# Patient Record
Sex: Male | Born: 1935 | Race: White | Hispanic: No | Smoking: Never smoker
Health system: Southern US, Community
[De-identification: ages and names within clinical notes are randomized; demographics above are authoritative.]

## PROBLEM LIST (undated history)

## (undated) DIAGNOSIS — E7800 Pure hypercholesterolemia, unspecified: Secondary | ICD-10-CM

## (undated) DIAGNOSIS — I10 Essential (primary) hypertension: Secondary | ICD-10-CM

## (undated) DIAGNOSIS — D649 Anemia, unspecified: Secondary | ICD-10-CM

## (undated) DIAGNOSIS — F419 Anxiety disorder, unspecified: Secondary | ICD-10-CM

## (undated) DIAGNOSIS — E119 Type 2 diabetes mellitus without complications: Secondary | ICD-10-CM

## (undated) DIAGNOSIS — F329 Major depressive disorder, single episode, unspecified: Secondary | ICD-10-CM

## (undated) DIAGNOSIS — E079 Disorder of thyroid, unspecified: Secondary | ICD-10-CM

## (undated) DIAGNOSIS — Z48815 Encounter for surgical aftercare following surgery on the digestive system: Secondary | ICD-10-CM

## (undated) DIAGNOSIS — F32A Depression, unspecified: Secondary | ICD-10-CM

## (undated) DIAGNOSIS — R1110 Vomiting, unspecified: Secondary | ICD-10-CM

## (undated) DIAGNOSIS — S92401A Displaced unspecified fracture of right great toe, initial encounter for closed fracture: Secondary | ICD-10-CM

## (undated) HISTORY — DX: Depression, unspecified: F32.A

## (undated) HISTORY — DX: Major depressive disorder, single episode, unspecified: F32.9

## (undated) HISTORY — DX: Essential (primary) hypertension: I10

## (undated) HISTORY — DX: Disorder of thyroid, unspecified: E07.9

## (undated) HISTORY — DX: Anxiety disorder, unspecified: F41.9

## (undated) HISTORY — DX: Vomiting, unspecified: R11.10

## (undated) HISTORY — DX: Displaced unspecified fracture of right great toe, initial encounter for closed fracture: S92.401A

## (undated) HISTORY — DX: Type 2 diabetes mellitus without complications: E11.9

## (undated) HISTORY — DX: Encounter for surgical aftercare following surgery on the digestive system: Z48.815

---

## 1985-07-08 HISTORY — PX: CORONARY ARTERY BYPASS GRAFT: SHX141

## 2006-02-28 ENCOUNTER — Ambulatory Visit: Payer: Self-pay | Admitting: Internal Medicine

## 2006-03-12 ENCOUNTER — Ambulatory Visit: Payer: Self-pay | Admitting: Gastroenterology

## 2006-03-12 ENCOUNTER — Encounter: Payer: Self-pay | Admitting: Gastroenterology

## 2006-03-13 ENCOUNTER — Ambulatory Visit: Payer: Self-pay | Admitting: Internal Medicine

## 2006-04-10 ENCOUNTER — Ambulatory Visit: Payer: Self-pay | Admitting: Internal Medicine

## 2006-04-23 ENCOUNTER — Ambulatory Visit: Payer: Self-pay | Admitting: Gastroenterology

## 2006-04-23 ENCOUNTER — Encounter: Payer: Self-pay | Admitting: Gastroenterology

## 2006-04-30 ENCOUNTER — Ambulatory Visit: Payer: Self-pay | Admitting: Gastroenterology

## 2006-05-08 ENCOUNTER — Ambulatory Visit: Payer: Self-pay | Admitting: Internal Medicine

## 2006-06-07 ENCOUNTER — Ambulatory Visit: Payer: Self-pay | Admitting: Internal Medicine

## 2006-07-08 ENCOUNTER — Ambulatory Visit: Payer: Self-pay | Admitting: Internal Medicine

## 2006-08-08 ENCOUNTER — Ambulatory Visit: Payer: Self-pay | Admitting: Internal Medicine

## 2006-09-06 ENCOUNTER — Ambulatory Visit: Payer: Self-pay | Admitting: Internal Medicine

## 2006-10-07 ENCOUNTER — Ambulatory Visit: Payer: Self-pay | Admitting: Internal Medicine

## 2006-11-06 ENCOUNTER — Ambulatory Visit: Payer: Self-pay | Admitting: Internal Medicine

## 2006-12-07 ENCOUNTER — Ambulatory Visit: Payer: Self-pay | Admitting: Internal Medicine

## 2007-01-06 ENCOUNTER — Ambulatory Visit: Payer: Self-pay | Admitting: Internal Medicine

## 2007-02-06 ENCOUNTER — Ambulatory Visit: Payer: Self-pay | Admitting: Internal Medicine

## 2007-03-09 ENCOUNTER — Ambulatory Visit: Payer: Self-pay | Admitting: Internal Medicine

## 2007-04-08 ENCOUNTER — Ambulatory Visit: Payer: Self-pay | Admitting: Internal Medicine

## 2007-04-16 ENCOUNTER — Ambulatory Visit: Payer: Self-pay | Admitting: Internal Medicine

## 2007-05-09 ENCOUNTER — Ambulatory Visit: Payer: Self-pay | Admitting: Internal Medicine

## 2007-06-08 ENCOUNTER — Ambulatory Visit: Payer: Self-pay | Admitting: Internal Medicine

## 2007-07-09 ENCOUNTER — Ambulatory Visit: Payer: Self-pay | Admitting: Internal Medicine

## 2007-08-19 ENCOUNTER — Ambulatory Visit: Payer: Self-pay | Admitting: Internal Medicine

## 2007-09-17 ENCOUNTER — Ambulatory Visit: Payer: Self-pay | Admitting: Internal Medicine

## 2007-09-30 ENCOUNTER — Ambulatory Visit: Payer: Self-pay | Admitting: Internal Medicine

## 2007-10-07 ENCOUNTER — Ambulatory Visit: Payer: Self-pay | Admitting: Internal Medicine

## 2007-11-11 ENCOUNTER — Ambulatory Visit: Payer: Self-pay | Admitting: Internal Medicine

## 2007-12-07 ENCOUNTER — Ambulatory Visit: Payer: Self-pay | Admitting: Internal Medicine

## 2008-01-06 ENCOUNTER — Ambulatory Visit: Payer: Self-pay | Admitting: Internal Medicine

## 2008-02-06 ENCOUNTER — Ambulatory Visit: Payer: Self-pay | Admitting: Internal Medicine

## 2008-03-02 ENCOUNTER — Encounter: Payer: Self-pay | Admitting: Gastroenterology

## 2008-03-08 ENCOUNTER — Ambulatory Visit: Payer: Self-pay | Admitting: Internal Medicine

## 2008-03-22 ENCOUNTER — Encounter: Payer: Self-pay | Admitting: Gastroenterology

## 2008-03-24 ENCOUNTER — Encounter: Payer: Self-pay | Admitting: Gastroenterology

## 2008-03-24 ENCOUNTER — Ambulatory Visit: Payer: Self-pay | Admitting: Gastroenterology

## 2008-03-29 ENCOUNTER — Encounter: Payer: Self-pay | Admitting: Gastroenterology

## 2008-04-01 ENCOUNTER — Encounter: Payer: Self-pay | Admitting: Gastroenterology

## 2008-04-06 ENCOUNTER — Encounter: Payer: Self-pay | Admitting: Gastroenterology

## 2008-04-07 ENCOUNTER — Ambulatory Visit: Payer: Self-pay | Admitting: Internal Medicine

## 2008-04-08 ENCOUNTER — Encounter: Payer: Self-pay | Admitting: Gastroenterology

## 2008-04-14 ENCOUNTER — Ambulatory Visit: Payer: Self-pay | Admitting: Gastroenterology

## 2008-04-14 ENCOUNTER — Encounter: Payer: Self-pay | Admitting: Gastroenterology

## 2008-04-14 ENCOUNTER — Ambulatory Visit (HOSPITAL_COMMUNITY): Admission: RE | Admit: 2008-04-14 | Discharge: 2008-04-14 | Payer: Self-pay | Admitting: Gastroenterology

## 2008-04-14 ENCOUNTER — Telehealth: Payer: Self-pay | Admitting: Internal Medicine

## 2008-04-25 ENCOUNTER — Ambulatory Visit: Payer: Self-pay | Admitting: Internal Medicine

## 2008-04-29 ENCOUNTER — Ambulatory Visit: Payer: Self-pay | Admitting: Internal Medicine

## 2008-05-02 ENCOUNTER — Ambulatory Visit: Payer: Self-pay | Admitting: Internal Medicine

## 2008-05-08 ENCOUNTER — Ambulatory Visit: Payer: Self-pay | Admitting: Internal Medicine

## 2008-06-07 ENCOUNTER — Ambulatory Visit: Payer: Self-pay | Admitting: Internal Medicine

## 2010-08-16 ENCOUNTER — Ambulatory Visit: Payer: Self-pay | Admitting: Internal Medicine

## 2010-08-21 ENCOUNTER — Ambulatory Visit: Payer: Self-pay | Admitting: Internal Medicine

## 2010-08-23 ENCOUNTER — Ambulatory Visit: Payer: Self-pay | Admitting: Internal Medicine

## 2010-09-06 ENCOUNTER — Ambulatory Visit: Payer: Self-pay | Admitting: Internal Medicine

## 2010-10-07 ENCOUNTER — Ambulatory Visit: Payer: Self-pay | Admitting: Internal Medicine

## 2010-11-06 ENCOUNTER — Ambulatory Visit: Payer: Self-pay | Admitting: Internal Medicine

## 2010-11-06 ENCOUNTER — Ambulatory Visit: Payer: Self-pay

## 2010-12-07 ENCOUNTER — Ambulatory Visit: Payer: Self-pay | Admitting: Internal Medicine

## 2010-12-07 HISTORY — PX: CORONARY ANGIOPLASTY WITH STENT PLACEMENT: SHX49

## 2011-01-06 ENCOUNTER — Ambulatory Visit: Payer: Self-pay | Admitting: Internal Medicine

## 2011-01-15 ENCOUNTER — Ambulatory Visit: Payer: Self-pay | Admitting: Internal Medicine

## 2011-02-06 ENCOUNTER — Ambulatory Visit: Payer: Self-pay | Admitting: Internal Medicine

## 2011-02-06 ENCOUNTER — Ambulatory Visit: Payer: Self-pay

## 2011-03-09 ENCOUNTER — Ambulatory Visit: Payer: Self-pay | Admitting: Internal Medicine

## 2011-04-08 ENCOUNTER — Ambulatory Visit: Payer: Self-pay | Admitting: Internal Medicine

## 2011-04-09 LAB — BODY FLUID CELL COUNT WITH DIFFERENTIAL
Lymphs, Fluid: 21
Neutrophil Count, Fluid: 3
Total Nucleated Cell Count, Fluid: 137

## 2011-04-09 LAB — DIFFERENTIAL
Lymphocytes Relative: 16
Lymphs Abs: 0.5 — ABNORMAL LOW
Neutro Abs: 2
Neutrophils Relative %: 67

## 2011-04-09 LAB — PROTEIN, BODY FLUID: Total protein, fluid: <3

## 2011-04-09 LAB — CBC
HCT: 35.6 — ABNORMAL LOW
Platelets: 100 — ABNORMAL LOW
WBC: 3.1 — ABNORMAL LOW

## 2011-04-09 LAB — GRAM STAIN

## 2011-04-09 LAB — GLUCOSE, CAPILLARY: Glucose-Capillary: 76

## 2011-04-09 LAB — ALBUMIN, FLUID (OTHER): Albumin, Fluid: 1.1

## 2011-05-09 ENCOUNTER — Ambulatory Visit: Payer: Self-pay | Admitting: Internal Medicine

## 2011-06-08 ENCOUNTER — Ambulatory Visit: Payer: Self-pay | Admitting: Internal Medicine

## 2011-07-05 ENCOUNTER — Ambulatory Visit: Payer: Self-pay | Admitting: Internal Medicine

## 2011-07-09 ENCOUNTER — Ambulatory Visit: Payer: Self-pay | Admitting: Internal Medicine

## 2011-07-18 LAB — CANCER CENTER HEMOGLOBIN: HGB: 8.9 g/dL — ABNORMAL LOW (ref 13.0–18.0)

## 2011-08-01 LAB — CANCER CENTER HEMOGLOBIN: HGB: 9.3 g/dL — ABNORMAL LOW (ref 13.0–18.0)

## 2011-08-09 ENCOUNTER — Ambulatory Visit: Payer: Self-pay | Admitting: Internal Medicine

## 2011-08-29 LAB — CANCER CENTER HEMOGLOBIN: HGB: 9.2 g/dL — ABNORMAL LOW (ref 13.0–18.0)

## 2011-09-06 ENCOUNTER — Ambulatory Visit: Payer: Self-pay | Admitting: Internal Medicine

## 2011-09-06 HISTORY — PX: CORONARY ANGIOPLASTY WITH STENT PLACEMENT: SHX49

## 2011-09-26 LAB — CANCER CENTER HEMOGLOBIN: HGB: 9.2 g/dL — ABNORMAL LOW (ref 13.0–18.0)

## 2011-10-07 ENCOUNTER — Ambulatory Visit: Payer: Self-pay | Admitting: Internal Medicine

## 2011-10-10 LAB — CANCER CENTER HEMOGLOBIN: HGB: 9.7 g/dL — ABNORMAL LOW (ref 13.0–18.0)

## 2011-10-24 LAB — CANCER CENTER HEMOGLOBIN: HGB: 9.5 g/dL — ABNORMAL LOW (ref 13.0–18.0)

## 2011-10-25 ENCOUNTER — Encounter: Payer: Self-pay | Admitting: Internal Medicine

## 2011-11-06 ENCOUNTER — Ambulatory Visit: Payer: Self-pay | Admitting: Internal Medicine

## 2011-11-06 ENCOUNTER — Encounter: Payer: Self-pay | Admitting: Internal Medicine

## 2011-11-07 LAB — IRON AND TIBC
Iron Bind.Cap.(Total): 255 ug/dL (ref 250–450)
Iron: 88 ug/dL (ref 65–175)
Unbound Iron-Bind.Cap.: 167 ug/dL

## 2011-11-07 LAB — CANCER CENTER HEMOGLOBIN: HGB: 9.5 g/dL — ABNORMAL LOW (ref 13.0–18.0)

## 2011-11-21 LAB — CBC CANCER CENTER
Basophil #: 0 x10 3/mm (ref 0.0–0.1)
Basophil %: 1.1 %
HCT: 27.5 % — ABNORMAL LOW (ref 40.0–52.0)
Lymphocyte #: 0.6 x10 3/mm — ABNORMAL LOW (ref 1.0–3.6)
Lymphocyte %: 12.9 %
MCH: 33.4 pg (ref 26.0–34.0)
MCV: 98 fL (ref 80–100)
Monocyte %: 12.6 %
Neutrophil #: 2.3 x10 3/mm (ref 1.4–6.5)
Neutrophil %: 54.3 %
RBC: 2.82 10*6/uL — ABNORMAL LOW (ref 4.40–5.90)
RDW: 14.3 % (ref 11.5–14.5)

## 2011-12-05 LAB — CANCER CENTER HEMOGLOBIN: HGB: 9.8 g/dL — ABNORMAL LOW (ref 13.0–18.0)

## 2011-12-07 ENCOUNTER — Encounter: Payer: Self-pay | Admitting: Internal Medicine

## 2011-12-07 ENCOUNTER — Ambulatory Visit: Payer: Self-pay | Admitting: Internal Medicine

## 2011-12-19 LAB — CANCER CENTER HEMOGLOBIN: HGB: 9.1 g/dL — ABNORMAL LOW (ref 13.0–18.0)

## 2012-01-06 ENCOUNTER — Encounter: Payer: Self-pay | Admitting: Internal Medicine

## 2012-01-06 ENCOUNTER — Ambulatory Visit: Payer: Self-pay | Admitting: Internal Medicine

## 2012-01-15 LAB — CANCER CENTER HEMOGLOBIN: HGB: 9.1 g/dL — ABNORMAL LOW (ref 13.0–18.0)

## 2012-01-30 LAB — CANCER CENTER HEMOGLOBIN: HGB: 9.5 g/dL — ABNORMAL LOW (ref 13.0–18.0)

## 2012-02-06 ENCOUNTER — Ambulatory Visit: Payer: Self-pay | Admitting: Internal Medicine

## 2012-02-13 LAB — CBC CANCER CENTER
Basophil #: 0 x10 3/mm (ref 0.0–0.1)
Basophil %: 0.9 %
HGB: 8.9 g/dL — ABNORMAL LOW (ref 13.0–18.0)
Lymphocyte #: 0.5 x10 3/mm — ABNORMAL LOW (ref 1.0–3.6)
Lymphocyte %: 13.6 %
MCHC: 33.9 g/dL (ref 32.0–36.0)
MCV: 99 fL (ref 80–100)
Monocyte #: 0.4 x10 3/mm (ref 0.2–1.0)
Neutrophil #: 1.9 x10 3/mm (ref 1.4–6.5)
Neutrophil %: 56.5 %
RBC: 2.67 10*6/uL — ABNORMAL LOW (ref 4.40–5.90)

## 2012-02-27 LAB — CANCER CENTER HEMOGLOBIN: HGB: 9.3 g/dL — ABNORMAL LOW (ref 13.0–18.0)

## 2012-03-08 ENCOUNTER — Ambulatory Visit: Payer: Self-pay | Admitting: Internal Medicine

## 2012-04-07 ENCOUNTER — Ambulatory Visit: Payer: Self-pay | Admitting: Internal Medicine

## 2012-04-23 LAB — CANCER CENTER HEMOGLOBIN: HGB: 9.4 g/dL — ABNORMAL LOW (ref 13.0–18.0)

## 2012-04-23 LAB — IRON AND TIBC
Iron Bind.Cap.(Total): 262 ug/dL (ref 250–450)
Iron: 72 ug/dL (ref 65–175)

## 2012-05-07 LAB — CBC CANCER CENTER
Basophil #: 0 x10 3/mm (ref 0.0–0.1)
HCT: 25.9 % — ABNORMAL LOW (ref 40.0–52.0)
Lymphocyte #: 0.5 x10 3/mm — ABNORMAL LOW (ref 1.0–3.6)
MCH: 33.5 pg (ref 26.0–34.0)
MCHC: 34.2 g/dL (ref 32.0–36.0)
MCV: 98 fL (ref 80–100)
Monocyte #: 0.5 x10 3/mm (ref 0.2–1.0)
Monocyte %: 12.1 %
Platelet: 106 x10 3/mm — ABNORMAL LOW (ref 150–440)
RDW: 13.2 % (ref 11.5–14.5)
WBC: 4.2 x10 3/mm (ref 3.8–10.6)

## 2012-05-08 ENCOUNTER — Ambulatory Visit: Payer: Self-pay | Admitting: Internal Medicine

## 2012-05-28 LAB — CANCER CENTER HEMOGLOBIN: HGB: 9.1 g/dL — ABNORMAL LOW (ref 13.0–18.0)

## 2012-06-07 ENCOUNTER — Ambulatory Visit: Payer: Self-pay | Admitting: Internal Medicine

## 2012-07-08 ENCOUNTER — Ambulatory Visit: Payer: Self-pay | Admitting: Internal Medicine

## 2012-08-08 ENCOUNTER — Ambulatory Visit: Payer: Self-pay | Admitting: Internal Medicine

## 2012-08-24 LAB — CBC CANCER CENTER
Basophil #: 0 x10 3/mm (ref 0.0–0.1)
Eosinophil #: 0.5 x10 3/mm (ref 0.0–0.7)
HGB: 9.1 g/dL — ABNORMAL LOW (ref 13.0–18.0)
Lymphocyte #: 0.5 x10 3/mm — ABNORMAL LOW (ref 1.0–3.6)
MCH: 33.3 pg (ref 26.0–34.0)
MCHC: 34.7 g/dL (ref 32.0–36.0)
MCV: 96 fL (ref 80–100)
Monocyte %: 11.9 %
WBC: 4.1 x10 3/mm (ref 3.8–10.6)

## 2012-09-05 ENCOUNTER — Ambulatory Visit: Payer: Self-pay | Admitting: Internal Medicine

## 2012-09-21 LAB — BASIC METABOLIC PANEL
BUN: 33 mg/dL — AB (ref 4–21)
Creatinine: 2.2 mg/dL — AB (ref <=1.3)

## 2012-10-06 ENCOUNTER — Ambulatory Visit: Payer: Self-pay | Admitting: Internal Medicine

## 2012-10-15 LAB — IRON AND TIBC
Iron Bind.Cap.(Total): 288 ug/dL (ref 250–450)
Iron Saturation: 31 %
Iron: 88 ug/dL (ref 65–175)
Unbound Iron-Bind.Cap.: 200 ug/dL

## 2012-10-15 LAB — CANCER CENTER HEMOGLOBIN: HGB: 9.4 g/dL — ABNORMAL LOW (ref 13.0–18.0)

## 2012-11-05 ENCOUNTER — Ambulatory Visit: Payer: Self-pay | Admitting: Internal Medicine

## 2012-11-12 LAB — CBC CANCER CENTER
Basophil %: 0.7 %
HCT: 26.5 % — ABNORMAL LOW (ref 40.0–52.0)
HGB: 8.9 g/dL — ABNORMAL LOW (ref 13.0–18.0)
Lymphocyte #: 0.4 x10 3/mm — ABNORMAL LOW (ref 1.0–3.6)
Lymphocyte %: 10.5 %
MCH: 32.5 pg (ref 26.0–34.0)
MCV: 97 fL (ref 80–100)
Neutrophil #: 2.7 x10 3/mm (ref 1.4–6.5)

## 2012-12-06 ENCOUNTER — Ambulatory Visit: Payer: Self-pay | Admitting: Internal Medicine

## 2013-01-07 ENCOUNTER — Ambulatory Visit: Payer: Self-pay | Admitting: Internal Medicine

## 2013-01-07 LAB — CANCER CENTER HEMOGLOBIN: HGB: 9.4 g/dL — ABNORMAL LOW (ref 13.0–18.0)

## 2013-02-05 ENCOUNTER — Ambulatory Visit: Payer: Self-pay | Admitting: Internal Medicine

## 2013-03-08 ENCOUNTER — Ambulatory Visit: Payer: Self-pay | Admitting: Internal Medicine

## 2013-04-29 ENCOUNTER — Ambulatory Visit: Payer: Self-pay | Admitting: Internal Medicine

## 2013-04-29 LAB — CANCER CENTER HEMOGLOBIN: HGB: 8.8 g/dL — ABNORMAL LOW (ref 13.0–18.0)

## 2013-05-08 ENCOUNTER — Ambulatory Visit: Payer: Self-pay | Admitting: Internal Medicine

## 2013-06-24 ENCOUNTER — Ambulatory Visit: Payer: Self-pay | Admitting: Internal Medicine

## 2013-06-24 LAB — CANCER CENTER HEMOGLOBIN: HGB: 9.3 g/dL — ABNORMAL LOW (ref 13.0–18.0)

## 2013-07-08 ENCOUNTER — Ambulatory Visit: Payer: Self-pay | Admitting: Internal Medicine

## 2013-07-08 HISTORY — PX: CARDIAC DEFIBRILLATOR PLACEMENT: SHX171

## 2013-08-18 ENCOUNTER — Ambulatory Visit: Payer: Self-pay | Admitting: Internal Medicine

## 2013-08-19 LAB — IRON AND TIBC
IRON BIND. CAP.(TOTAL): 262 ug/dL (ref 250–450)
IRON: 65 ug/dL (ref 65–175)
Iron Saturation: 25 %
UNBOUND IRON-BIND. CAP.: 197 ug/dL

## 2013-08-19 LAB — CANCER CENTER HEMOGLOBIN: HGB: 8.9 g/dL — AB (ref 13.0–18.0)

## 2013-08-19 LAB — FOLATE: FOLIC ACID: 57.7 ng/mL (ref 3.1–100.0)

## 2013-08-19 LAB — FERRITIN: Ferritin (ARMC): 113 ng/mL (ref 8–388)

## 2013-09-05 ENCOUNTER — Ambulatory Visit: Payer: Self-pay | Admitting: Internal Medicine

## 2013-10-14 ENCOUNTER — Ambulatory Visit: Payer: Self-pay | Admitting: Internal Medicine

## 2013-10-14 LAB — CBC CANCER CENTER
Basophil #: 0 x10 3/mm (ref 0.0–0.1)
Basophil %: 1.1 %
EOS PCT: 12.6 %
Eosinophil #: 0.5 x10 3/mm (ref 0.0–0.7)
HCT: 27.7 % — AB (ref 40.0–52.0)
HGB: 9.2 g/dL — ABNORMAL LOW (ref 13.0–18.0)
Lymphocyte #: 0.5 x10 3/mm — ABNORMAL LOW (ref 1.0–3.6)
Lymphocyte %: 13.4 %
MCH: 32.6 pg (ref 26.0–34.0)
MCHC: 33.4 g/dL (ref 32.0–36.0)
MCV: 98 fL (ref 80–100)
Monocyte #: 0.4 x10 3/mm (ref 0.2–1.0)
Monocyte %: 10.7 %
NEUTROS ABS: 2.4 x10 3/mm (ref 1.4–6.5)
Neutrophil %: 62.2 %
Platelet: 110 x10 3/mm — ABNORMAL LOW (ref 150–440)
RBC: 2.83 10*6/uL — AB (ref 4.40–5.90)
RDW: 13.7 % (ref 11.5–14.5)
WBC: 3.9 x10 3/mm (ref 3.8–10.6)

## 2013-10-22 ENCOUNTER — Encounter: Payer: Self-pay | Admitting: Internal Medicine

## 2013-11-05 ENCOUNTER — Ambulatory Visit: Payer: Self-pay | Admitting: Internal Medicine

## 2014-02-18 DIAGNOSIS — R7689 Other specified abnormal immunological findings in serum: Secondary | ICD-10-CM | POA: Insufficient documentation

## 2014-02-18 DIAGNOSIS — R768 Other specified abnormal immunological findings in serum: Secondary | ICD-10-CM | POA: Insufficient documentation

## 2014-06-23 LAB — PSA

## 2014-06-23 LAB — HEMOGLOBIN A1C: Hgb A1c MFr Bld: 5.9 % (ref 4.0–6.0)

## 2014-06-23 LAB — TSH: TSH: 1.12 u[IU]/mL (ref <=5.90)

## 2014-07-16 LAB — HM DIABETES EYE EXAM

## 2014-10-19 DIAGNOSIS — R1313 Dysphagia, pharyngeal phase: Secondary | ICD-10-CM | POA: Insufficient documentation

## 2014-11-14 ENCOUNTER — Encounter: Payer: Self-pay | Admitting: Internal Medicine

## 2014-11-14 DIAGNOSIS — M179 Osteoarthritis of knee, unspecified: Secondary | ICD-10-CM | POA: Insufficient documentation

## 2014-11-14 DIAGNOSIS — M1710 Unilateral primary osteoarthritis, unspecified knee: Secondary | ICD-10-CM | POA: Insufficient documentation

## 2014-11-14 DIAGNOSIS — N189 Chronic kidney disease, unspecified: Secondary | ICD-10-CM

## 2014-11-14 DIAGNOSIS — I2510 Atherosclerotic heart disease of native coronary artery without angina pectoris: Secondary | ICD-10-CM | POA: Insufficient documentation

## 2014-11-14 DIAGNOSIS — E785 Hyperlipidemia, unspecified: Secondary | ICD-10-CM

## 2014-11-14 DIAGNOSIS — N183 Chronic kidney disease, stage 3 unspecified: Secondary | ICD-10-CM | POA: Insufficient documentation

## 2014-11-14 DIAGNOSIS — D631 Anemia in chronic kidney disease: Secondary | ICD-10-CM | POA: Insufficient documentation

## 2014-11-14 DIAGNOSIS — E039 Hypothyroidism, unspecified: Secondary | ICD-10-CM | POA: Insufficient documentation

## 2014-11-14 DIAGNOSIS — I10 Essential (primary) hypertension: Secondary | ICD-10-CM | POA: Insufficient documentation

## 2014-11-14 DIAGNOSIS — F321 Major depressive disorder, single episode, moderate: Secondary | ICD-10-CM | POA: Insufficient documentation

## 2014-11-14 DIAGNOSIS — E119 Type 2 diabetes mellitus without complications: Secondary | ICD-10-CM | POA: Insufficient documentation

## 2014-11-14 DIAGNOSIS — I255 Ischemic cardiomyopathy: Secondary | ICD-10-CM | POA: Insufficient documentation

## 2014-11-14 DIAGNOSIS — R634 Abnormal weight loss: Secondary | ICD-10-CM | POA: Insufficient documentation

## 2014-11-14 DIAGNOSIS — I700 Atherosclerosis of aorta: Secondary | ICD-10-CM | POA: Insufficient documentation

## 2014-11-14 DIAGNOSIS — E1169 Type 2 diabetes mellitus with other specified complication: Secondary | ICD-10-CM | POA: Insufficient documentation

## 2014-11-14 DIAGNOSIS — D291 Benign neoplasm of prostate: Secondary | ICD-10-CM | POA: Insufficient documentation

## 2014-11-14 DIAGNOSIS — D692 Other nonthrombocytopenic purpura: Secondary | ICD-10-CM | POA: Insufficient documentation

## 2014-11-14 DIAGNOSIS — F5101 Primary insomnia: Secondary | ICD-10-CM | POA: Insufficient documentation

## 2015-03-27 ENCOUNTER — Other Ambulatory Visit: Payer: Self-pay | Admitting: Internal Medicine

## 2015-05-08 ENCOUNTER — Ambulatory Visit (INDEPENDENT_AMBULATORY_CARE_PROVIDER_SITE_OTHER): Payer: Medicare Other

## 2015-05-08 DIAGNOSIS — Z23 Encounter for immunization: Secondary | ICD-10-CM | POA: Diagnosis not present

## 2015-05-19 ENCOUNTER — Other Ambulatory Visit: Payer: Self-pay | Admitting: Internal Medicine

## 2015-05-24 ENCOUNTER — Other Ambulatory Visit: Payer: Self-pay | Admitting: Internal Medicine

## 2015-08-04 ENCOUNTER — Ambulatory Visit
Admission: EM | Admit: 2015-08-04 | Discharge: 2015-08-04 | Disposition: A | Discharge status: Home / Self Care | Payer: 59 | Attending: Family Medicine | Admitting: Family Medicine

## 2015-08-04 ENCOUNTER — Ambulatory Visit (INDEPENDENT_AMBULATORY_CARE_PROVIDER_SITE_OTHER): Payer: 59

## 2015-08-04 DIAGNOSIS — J40 Bronchitis, not specified as acute or chronic: Secondary | ICD-10-CM

## 2015-08-04 HISTORY — DX: Anemia, unspecified: D64.9

## 2015-08-04 HISTORY — DX: Pure hypercholesterolemia, unspecified: E78.00

## 2015-08-04 MED ORDER — ALBUTEROL SULFATE HFA 108 (90 BASE) MCG/ACT IN AERS: 1.0000 | INHALATION_SPRAY | Freq: Four times a day (QID) | RESPIRATORY_TRACT | Status: DC | PRN

## 2015-08-04 MED ORDER — LEVOFLOXACIN 500 MG PO TABS: 500.0000 mg | ORAL_TABLET | Freq: Every day | ORAL | Status: DC

## 2015-08-04 MED ORDER — HYDROCOD POLST-CPM POLST ER 10-8 MG/5ML PO SUER: 2.5000 mL | Freq: Two times a day (BID) | ORAL | Status: DC

## 2015-08-04 NOTE — ED Provider Notes (Signed)
CSN: [id number]     Arrival date & time 08/04/15  [id number] History   First MD Initiated Contact with Patient 08/04/15 1020     Chief Complaint  Patient presents with  . URI   (Consider location/radiation/quality/duration/timing/severity/associated sxs/prior Treatment) HPI   This is an 82 year old male who has had a one half week history of cough. He states that the cough is productive some yellow and some white sputum. He has recently been under the care at [facility] for esophageal dysmotility and has had esophageal dilatation. He states that he will vomit frequently sometimes eating half a meal vomiting up in an returning to the rest. Has last 40 pounds over this last year. Quite possible that he may have aspirated. Cough sounds awful but he is afebrile today with an O2 sat of 98% on room air. He does not smoke and never has smoked execpt for a brief period when he was 82 years old.  Past Medical History  Diagnosis Date  . Hypercholesteremia   . Anemia    Past Surgical History  Procedure Laterality Date  . Coronary artery bypass graft  1987    5 vessel  . Coronary angioplasty with stent placement  12/2010  . Coronary angioplasty with stent placement  09/2011    DES placed  . Cardiac defibrillator placement  2015   Family History  Problem Relation Age of Onset  . Stroke Mother   . CAD Father   . Diabetes Brother    Social History  Substance Use Topics  . Smoking status: Never Smoker   . Smokeless tobacco: None  . Alcohol Use: No    Review of Systems  Constitutional: Positive for appetite change and fatigue. Negative for fever and chills.  HENT: Positive for congestion, postnasal drip and sinus pressure.   Respiratory: Positive for cough and shortness of breath. Negative for wheezing and stridor.   Gastrointestinal: Positive for nausea and vomiting.  All other systems reviewed and are negative.   Allergies  Review of patient's allergies indicates not on  file.  Home Medications   Prior to Admission medications   Medication Sig Start Date End Date Taking? Authorizing Provider  aspirin 81 MG chewable tablet Chew 1 tablet by mouth daily.   Yes Historical Provider, MD  clonazePAM (KLONOPIN) 0.25 MG disintegrating tablet Take 1 tablet by mouth 3 (three) times daily as needed. 06/23/14  Yes Historical Provider, MD  finasteride (PROSCAR) 5 MG tablet Take 1 tablet by mouth daily. 11/22/13  Yes Historical Provider, MD  furosemide (LASIX) 20 MG tablet Take 1 tablet by mouth 3 (three) times a week.   Yes Historical Provider, MD  glipiZIDE (GLUCOTROL XL) 5 MG 24 hr tablet Take 1 tablet by mouth daily. 04/22/14  Yes Historical Provider, MD  glucose blood test strip 1 each by Other route 2 (two) times daily. 07/25/14  Yes Historical Provider, MD  hydrALAZINE (APRESOLINE) 100 MG tablet Take 1 tablet by mouth 3 (three) times daily.   Yes Historical Provider, MD  lactulose (CHRONULAC) 10 GM/15ML solution Take by mouth.   Yes Historical Provider, MD  levothyroxine (SYNTHROID, LEVOTHROID) 75 MCG tablet TAKE 1 TABLET DAILY 05/24/15  Yes Glean Hess, MD  metoprolol succinate (TOPROL-XL) 25 MG 24 hr tablet Take 1 tablet by mouth daily.   Yes Historical Provider, MD  ondansetron (ZOFRAN) 4 MG tablet Take 1 tablet by mouth 3 (three) times daily as needed.   Yes Historical Provider, MD  simvastatin (ZOCOR) 20 MG  tablet TAKE 1 TABLET AT BEDTIME 05/19/15  Yes Glean Hess, MD  tamsulosin [facility]) 0.4 MG CAPS capsule TAKE 1 CAPSULE TWICE A DAY 03/27/15  Yes Glean Hess, MD  ticagrelor (BRILINTA) 90 MG TABS tablet Take 1 tablet by mouth 2 (two) times daily.   Yes Historical Provider, MD  albuterol (PROVENTIL HFA;VENTOLIN HFA) 108 (90 Base) MCG/ACT inhaler Inhale 1-2 puffs into the lungs every 6 (six) hours as needed for wheezing or shortness of breath. 08/04/15   Lorin Picket, PA-C  chlorpheniramine-HYDROcodone (TUSSIONEX PENNKINETIC ER) 10-8 MG/5ML SUER Take  2.5 mLs by mouth 2 (two) times daily. 08/04/15   Lorin Picket, PA-C  levofloxacin (LEVAQUIN) 500 MG tablet Take 1 tablet (500 mg total) by mouth daily. 08/04/15   Lorin Picket, PA-C  nitroGLYCERIN (NITRODUR - DOSED IN MG/24 HR) 0.4 mg/hr patch Place 1 patch onto the skin daily.    Historical Provider, MD  nitroGLYCERIN (NITROSTAT) 0.4 MG SL tablet Place 1 tablet under the tongue as needed.    Historical Provider, MD  PEDIATRIC MULTIPLE VIT-C-FA PO Take by mouth.    Historical Provider, MD   Meds Ordered and Administered this Visit  Medications - No data to display  BP 152/63 mmHg  Pulse 64  Temp(Src) 97.1 F (36.2 C) (Tympanic)  Resp 18  Ht 5' 8.5" (1.74 m)  Wt 130 lb (58.968 kg)  BMI 19.48 kg/m2  SpO2 98% No data found.   Physical Exam  Constitutional: He is oriented to person, place, and time. He appears well-developed and well-nourished. No distress.  HENT:  Head: Normocephalic and atraumatic.  Right Ear: External ear normal.  Left Ear: External ear normal.  Nose: Nose normal.  Mouth/Throat: Oropharynx is clear and moist. No oropharyngeal exudate.  Eyes: Conjunctivae are normal. Pupils are equal, round, and reactive to light.  Neck: Normal range of motion. Neck supple.  Pulmonary/Chest: Effort normal. No stridor. No respiratory distress. He has rales.  Musculoskeletal: Normal range of motion.  Examination used cane for ambulation and has an unsteady gait.  Lymphadenopathy:    He has no cervical adenopathy.  Neurological: He is alert and oriented to person, place, and time.  Skin: Skin is warm and dry. He is not diaphoretic.  Psychiatric: He has a normal mood and affect. His behavior is normal. Judgment and thought content normal.  Nursing note and vitals reviewed.   ED Course  Procedures (including critical care time)  Labs Review Labs Reviewed - No data to display  Imaging Review Dg Chest 2 View  08/04/2015  CLINICAL DATA:  Crackles in the left lower lobe.  Possible aspiration. Congestion for 2 weeks. Productive cough. EXAM: CHEST  2 VIEW COMPARISON:  07/05/2011 FINDINGS: The lungs are hyperinflated likely secondary to COPD. There is no focal parenchymal opacity. There is no pleural effusion or pneumothorax. The heart and mediastinal contours are unremarkable. There is a single lead cardiac pacemaker. There is evidence of prior CABG. The osseous structures are unremarkable. IMPRESSION: No active cardiopulmonary disease. Electronically Signed   By: Kathreen Devoid   On: 08/04/2015 11:04     Visual Acuity Review  Right Eye Distance:   Left Eye Distance:   Bilateral Distance:    Right Eye Near:   Left Eye Near:    Bilateral Near:         MDM   1. Bronchitis    Discharge Medication List as of 08/04/2015 11:49 AM    START taking these medications  Details  albuterol (PROVENTIL HFA;VENTOLIN HFA) 108 (90 Base) MCG/ACT inhaler Inhale 1-2 puffs into the lungs every 6 (six) hours as needed for wheezing or shortness of breath., Starting 08/04/2015, Until Discontinued, Normal    chlorpheniramine-HYDROcodone (TUSSIONEX PENNKINETIC ER) 10-8 MG/5ML SUER Take 2.5 mLs by mouth 2 (two) times daily., Starting 08/04/2015, Until Discontinued, Print    levofloxacin (LEVAQUIN) 500 MG tablet Take 1 tablet (500 mg total) by mouth daily., Starting 08/04/2015, Until Discontinued, Normal      Plan: 1. Test/x-ray results and diagnosis reviewed with patient 2. rx as per orders; risks, benefits, potential side effects reviewed with patient 3. Recommend supportive treatment with use of inhaler when necessary. Concerned that he may have an aspiration pneumonia that has not caught up with the x-ray at this point time. From his significant amount of crackles in the left base as well as his productive cough empirically started him on some Levaquin. Discussed with Dr. Alveta Heimlich. Also gave him some Tussionex so he may have a better rest at nighttime. I highly recommend that he  follow-up with Dr. Army Melia next week. He is not progressing over the weekend or if he is worsening I have recommended that he go to emergency room immediately. 4. F/u prn if symptoms worsen or don't improve     Lorin Picket, PA-C 08/04/15 2116

## 2015-08-04 NOTE — ED Notes (Signed)
States been sick with cough and sinus congestion. + productive cough

## 2015-08-04 NOTE — Discharge Instructions (Signed)
Upper Respiratory Infection, Adult Most upper respiratory infections (URIs) are a viral infection of the air passages leading to the lungs. A URI affects the nose, throat, and upper air passages. The most common type of URI is nasopharyngitis and is typically referred to as "the common cold." URIs run their course and usually go away on their own. Most of the time, a URI does not require medical attention, but sometimes a bacterial infection in the upper airways can follow a viral infection. This is called a secondary infection. Sinus and middle ear infections are common types of secondary upper respiratory infections. Bacterial pneumonia can also complicate a URI. A URI can worsen asthma and chronic obstructive pulmonary disease (COPD). Sometimes, these complications can require emergency medical care and may be life threatening.  CAUSES Almost all URIs are caused by viruses. A virus is a type of germ and can spread from one person to another.  RISKS FACTORS You may be at risk for a URI if:  1. You smoke.  2. You have chronic heart or lung disease. 3. You have a weakened defense (immune) system.  4. You are very young or very old.  5. You have nasal allergies or asthma. 6. You work in crowded or poorly ventilated areas. 7. You work in health care facilities or schools. SIGNS AND SYMPTOMS  Symptoms typically develop 2-3 days after you come in contact with a cold virus. Most viral URIs last 7-10 days. However, viral URIs from the influenza virus (flu virus) can last 14-18 days and are typically more severe. Symptoms may include:  1. Runny or stuffy (congested) nose.  2. Sneezing.  3. Cough.  4. Sore throat.  5. Headache.  6. Fatigue.  7. Fever.  8. Loss of appetite.  9. Pain in your forehead, behind your eyes, and over your cheekbones (sinus pain). 10. Muscle aches.  DIAGNOSIS  Your health care provider may diagnose a URI by:  Physical exam.  Tests to check that your  symptoms are not due to another condition such as:  Strep throat.  Sinusitis.  Pneumonia.  Asthma. TREATMENT  A URI goes away on its own with time. It cannot be cured with medicines, but medicines may be prescribed or recommended to relieve symptoms. Medicines may help:  Reduce your fever.  Reduce your cough.  Relieve nasal congestion. HOME CARE INSTRUCTIONS   Take medicines only as directed by your health care provider.   Gargle warm saltwater or take cough drops to comfort your throat as directed by your health care provider.  Use a warm mist humidifier or inhale steam from a shower to increase air moisture. This may make it easier to breathe.  Drink enough fluid to keep your urine clear or pale yellow.   Eat soups and other clear broths and maintain good nutrition.   Rest as needed.   Return to work when your temperature has returned to normal or as your health care provider advises. You may need to stay home longer to avoid infecting others. You can also use a face mask and careful hand washing to prevent spread of the virus.  Increase the usage of your inhaler if you have asthma.   Do not use any tobacco products, including cigarettes, chewing tobacco, or electronic cigarettes. If you need help quitting, ask your health care provider. PREVENTION  The best way to protect yourself from getting a cold is to practice good hygiene.   Avoid oral or hand contact with people with cold  symptoms.   Wash your hands often if contact occurs.  There is no clear evidence that vitamin C, vitamin E, echinacea, or exercise reduces the chance of developing a cold. However, it is always recommended to get plenty of rest, exercise, and practice good nutrition.  SEEK MEDICAL CARE IF:   You are getting worse rather than better.   Your symptoms are not controlled by medicine.   You have chills.  You have worsening shortness of breath.  You have brown or red mucus.  You  have yellow or brown nasal discharge.  You have pain in your face, especially when you bend forward.  You have a fever.  You have swollen neck glands.  You have pain while swallowing.  You have white areas in the back of your throat. SEEK IMMEDIATE MEDICAL CARE IF:   You have severe or persistent:  Headache.  Ear pain.  Sinus pain.  Chest pain.  You have chronic lung disease and any of the following:  Wheezing.  Prolonged cough.  Coughing up blood.  A change in your usual mucus.  You have a stiff neck.  You have changes in your:  Vision.  Hearing.  Thinking.  Mood. MAKE SURE YOU:   Understand these instructions.  Will watch your condition.  Will get help right away if you are not doing well or get worse.   This information is not intended to replace advice given to you by your health care provider. Make sure you discuss any questions you have with your health care provider.   Document Released: 12/18/2000 Document Revised: 11/08/2014 Document Reviewed: 09/29/2013 Elsevier Interactive Patient Education 2016 [employer].  How to Use an Inhaler Proper inhaler technique is very important. Good technique ensures that the medicine reaches the lungs. Poor technique results in depositing the medicine on the tongue and back of the throat rather than in the airways. If you do not use the inhaler with good technique, the medicine will not help you. STEPS TO FOLLOW IF USING AN INHALER WITHOUT AN EXTENSION TUBE 8. Remove the cap from the inhaler. 9. If you are using the inhaler for the first time, you will need to prime it. Shake the inhaler for 5 seconds and release four puffs into the air, away from your face. Ask your health care provider or pharmacist if you have questions about priming your inhaler. 10. Shake the inhaler for 5 seconds before each breath in (inhalation). 11. Position the inhaler so that the top of the canister faces up. 12. Put your index  finger on the top of the medicine canister. Your thumb supports the bottom of the inhaler. 13. Open your mouth. 14. Either place the inhaler between your teeth and place your lips tightly around the mouthpiece, or hold the inhaler 1-2 inches away from your open mouth. If you are unsure of which technique to use, ask your health care provider. 15. Breathe out (exhale) normally and as completely as possible. 16. Press the canister down with your index finger to release the medicine. 17. At the same time as the canister is pressed, inhale deeply and slowly until your lungs are completely filled. This should take 4-6 seconds. Keep your tongue down. 18. Hold the medicine in your lungs for 5-10 seconds (10 seconds is best). This helps the medicine get into the small airways of your lungs. 19. Breathe out slowly, through pursed lips. Whistling is an example of pursed lips. 20. Wait at least 15-30 seconds between puffs. Continue  with the above steps until you have taken the number of puffs your health care provider has ordered. Do not use the inhaler more than your health care provider tells you. 21. Replace the cap on the inhaler. 22. Follow the directions from your health care provider or the inhaler insert for cleaning the inhaler. STEPS TO FOLLOW IF USING AN INHALER WITH AN EXTENSION (SPACER) 11. Remove the cap from the inhaler. 12. If you are using the inhaler for the first time, you will need to prime it. Shake the inhaler for 5 seconds and release four puffs into the air, away from your face. Ask your health care provider or pharmacist if you have questions about priming your inhaler. 13. Shake the inhaler for 5 seconds before each breath in (inhalation). 14. Place the open end of the spacer onto the mouthpiece of the inhaler. 15. Position the inhaler so that the top of the canister faces up and the spacer mouthpiece faces you. 16. Put your index finger on the top of the medicine canister. Your thumb  supports the bottom of the inhaler and the spacer. 17. Breathe out (exhale) normally and as completely as possible. 18. Immediately after exhaling, place the spacer between your teeth and into your mouth. Close your lips tightly around the spacer. 19. Press the canister down with your index finger to release the medicine. 20. At the same time as the canister is pressed, inhale deeply and slowly until your lungs are completely filled. This should take 4-6 seconds. Keep your tongue down and out of the way. 21. Hold the medicine in your lungs for 5-10 seconds (10 seconds is best). This helps the medicine get into the small airways of your lungs. Exhale. 22. Repeat inhaling deeply through the spacer mouthpiece. Again hold that breath for up to 10 seconds (10 seconds is best). Exhale slowly. If it is difficult to take this second deep breath through the spacer, breathe normally several times through the spacer. Remove the spacer from your mouth. 23. Wait at least 15-30 seconds between puffs. Continue with the above steps until you have taken the number of puffs your health care provider has ordered. Do not use the inhaler more than your health care provider tells you. 24. Remove the spacer from the inhaler, and place the cap on the inhaler. 25. Follow the directions from your health care provider or the inhaler insert for cleaning the inhaler and spacer. If you are using different kinds of inhalers, use your quick relief medicine to open the airways 10-15 minutes before using a steroid if instructed to do so by your health care provider. If you are unsure which inhalers to use and the order of using them, ask your health care provider, nurse, or respiratory therapist. If you are using a steroid inhaler, always rinse your mouth with water after your last puff, then gargle and spit out the water. Do not swallow the water. AVOID:  Inhaling before or after starting the spray of medicine. It takes practice to  coordinate your breathing with triggering the spray.  Inhaling through the nose (rather than the mouth) when triggering the spray. HOW TO DETERMINE IF YOUR INHALER IS FULL OR NEARLY EMPTY You cannot know when an inhaler is empty by shaking it. A few inhalers are now being made with dose counters. Ask your health care provider for a prescription that has a dose counter if you feel you need that extra help. If your inhaler does not have a counter,  ask your health care provider to help you determine the date you need to refill your inhaler. Write the refill date on a calendar or your inhaler canister. Refill your inhaler 7-10 days before it runs out. Be sure to keep an adequate supply of medicine. This includes making sure it is not expired, and that you have a spare inhaler.  SEEK MEDICAL CARE IF:   Your symptoms are only partially relieved with your inhaler.  You are having trouble using your inhaler.  You have some increase in phlegm. SEEK IMMEDIATE MEDICAL CARE IF:   You feel little or no relief with your inhalers. You are still wheezing and are feeling shortness of breath or tightness in your chest or both.  You have dizziness, headaches, or a fast heart rate.  You have chills, fever, or night sweats.  You have a noticeable increase in phlegm production, or there is blood in the phlegm. MAKE SURE YOU:   Understand these instructions.  Will watch your condition.  Will get help right away if you are not doing well or get worse.   This information is not intended to replace advice given to you by your health care provider. Make sure you discuss any questions you have with your health care provider.   Document Released: 06/21/2000 Document Revised: 04/14/2013 Document Reviewed: 01/21/2013 Elsevier Interactive Patient Education [employer].

## 2015-08-18 ENCOUNTER — Ambulatory Visit: Payer: Self-pay | Admitting: Internal Medicine

## 2015-09-27 ENCOUNTER — Ambulatory Visit (INDEPENDENT_AMBULATORY_CARE_PROVIDER_SITE_OTHER): Payer: Medicare Other | Admitting: Internal Medicine

## 2015-09-27 ENCOUNTER — Encounter: Payer: Self-pay | Admitting: Internal Medicine

## 2015-09-27 VITALS — BP 116/72 | HR 64 | Ht 68.5 in | Wt 123.0 lb

## 2015-09-27 DIAGNOSIS — E119 Type 2 diabetes mellitus without complications: Secondary | ICD-10-CM | POA: Diagnosis not present

## 2015-09-27 DIAGNOSIS — I255 Ischemic cardiomyopathy: Secondary | ICD-10-CM | POA: Diagnosis not present

## 2015-09-27 DIAGNOSIS — N189 Chronic kidney disease, unspecified: Secondary | ICD-10-CM

## 2015-09-27 DIAGNOSIS — R634 Abnormal weight loss: Secondary | ICD-10-CM

## 2015-09-27 DIAGNOSIS — E039 Hypothyroidism, unspecified: Secondary | ICD-10-CM

## 2015-09-27 DIAGNOSIS — R1310 Dysphagia, unspecified: Secondary | ICD-10-CM | POA: Diagnosis not present

## 2015-09-27 DIAGNOSIS — I10 Essential (primary) hypertension: Secondary | ICD-10-CM | POA: Diagnosis not present

## 2015-09-27 DIAGNOSIS — D631 Anemia in chronic kidney disease: Secondary | ICD-10-CM

## 2015-09-27 NOTE — Progress Notes (Signed)
Date:  09/27/2015   Name:  Mason Kane.   DOB:  Feb 01, 1925   MRN:  [id number]   Chief Complaint: Weight Loss and Emesis Patient has a long history of vomiting and difficulty swallowing. He's been worked up at [facility] in [place]. He said EGD and dilatation, esophageal biopsies, esophageal manometry, abdominal and chest CTs. His most recent EGD demonstrated no difficulty passing the scope and no evidence of stricture. He had biopsies suggesting lymphocytic esophagitis but he did not improve budesonide so this was discontinued. History chemical staining for fungus was also negative. His most recent visit he was recommended that he begin drinking boost or injure supplements and have his dentures refitted to improve his ability to chew and swallow. His weight at that time was 123 pounds.Per [facility] report he does not like the taste of supplements and has not had his dentures repaired. He is here with a friend who brings him today. We discussed the possibility that anxiety might be contributing to his GI symptoms. He is relatively stressed out trying to care for his disabled wife. He has a son who lives with him but works most of the day. The patient is currently not driving-the last time he tried to drive he felt very unsafe. Insomnia - Is currently taking low-dose clonazepam to help with sleep. When he was taking this more frequent only for anxiety he said it made him very unsteady. Previously he was on low dose alprazolam. Anxiety - patient is not on an SSRI. He was on clonazepam 3 times a day but decreased at only at bedtime. I questioned him carefully regarding anxiety and its relationship to the vomiting. He's very clear that he only vomits when he eats. He does not wake up at night with vomiting. He does not vomit if he fasts. I do not think that a change in medication is appropriate at this time. However he might well benefit from home health services for both he and his wife. Emesis  This is a chronic  problem. The current episode started more than 1 year ago. The problem occurs 5 to 10 times per day. The problem has been unchanged. The emesis has an appearance of stomach contents. There has been no fever. Associated symptoms include arthralgias. Pertinent negatives include no abdominal pain, chest pain, chills, coughing, fever or headaches.  Diabetes He presents for his follow-up diabetic visit. He has type 2 diabetes mellitus. Hypoglycemia symptoms include nervousness/anxiousness. Pertinent negatives for hypoglycemia include no headaches or tremors. Associated symptoms include fatigue and weakness. Pertinent negatives for diabetes include no chest pain. He monitors blood glucose at home 1-2 x per day. His breakfast blood glucose is taken between 6-7 am. His breakfast blood glucose range is generally 130-140 mg/dl.    Review of Systems  Constitutional: Positive for appetite change and fatigue. Negative for fever and chills.  HENT: Positive for mouth sores. Negative for sore throat and tinnitus.   Respiratory: Negative for cough, choking, chest tightness, shortness of breath and wheezing.   Cardiovascular: Negative for chest pain, palpitations and leg swelling.  Gastrointestinal: Positive for nausea and vomiting. Negative for abdominal pain, constipation and abdominal distention.  Musculoskeletal: Positive for arthralgias and gait problem.  Skin: Negative for rash.  Neurological: Positive for weakness and light-headedness. Negative for tremors, syncope and headaches.  Hematological: Negative for adenopathy.  Psychiatric/Behavioral: Positive for sleep disturbance. Negative for dysphoric mood. The patient is nervous/anxious.     Patient Active Problem List  Diagnosis Date Noted  . Dysphagia 09/27/2015  . Acquired hypothyroidism 11/14/2014  . Anemia due to chronic kidney disease 11/14/2014  . 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 Kidney failure 11/14/2014  . CAD in native artery  11/14/2014  . Dyslipidemia 11/14/2014  . Essential (primary) hypertension 11/14/2014  . Acid reflux 11/14/2014  . Cirrhosis ([place]) 11/14/2014  . Cardiomyopathy, ischemic 11/14/2014  . Depression, major, single episode, moderate ([place]) 11/14/2014  . Idiopathic insomnia 11/14/2014  . Arthritis of knee, degenerative 11/14/2014  . Purpura senilis ([place]) 11/14/2014  . Aortic atherosclerosis ([place]) 11/14/2014  . Diabetes mellitus, type 2 ([place]) 11/14/2014  . Weight loss 11/14/2014  . Dysphagia, pharyngeal 10/19/2014    Prior to Admission medications   Medication Sig Start Date End Date Taking? Authorizing Provider  albuterol (PROVENTIL HFA;VENTOLIN HFA) 108 (90 Base) MCG/ACT inhaler Inhale 1-2 puffs into the lungs every 6 (six) hours as needed for wheezing or shortness of breath. 08/04/15   Lorin Picket, PA-C  aspirin 81 MG chewable tablet Chew 1 tablet by mouth daily.    Historical Provider, MD  chlorpheniramine-HYDROcodone (TUSSIONEX PENNKINETIC ER) 10-8 MG/5ML SUER Take 2.5 mLs by mouth 2 (two) times daily. 08/04/15   Lorin Picket, PA-C  clonazePAM (KLONOPIN) 0.25 MG disintegrating tablet Take 1 tablet by mouth 3 (three) times daily as needed. 06/23/14   Historical Provider, MD  finasteride (PROSCAR) 5 MG tablet Take 1 tablet by mouth daily. 11/22/13   Historical Provider, MD  furosemide (LASIX) 20 MG tablet Take 1 tablet by mouth 3 (three) times a week.    Historical Provider, MD  glipiZIDE (GLUCOTROL XL) 5 MG 24 hr tablet Take 1 tablet by mouth daily. 04/22/14   Historical Provider, MD  glucose blood test strip 1 each by Other route 2 (two) times daily. 07/25/14   Historical Provider, MD  hydrALAZINE (APRESOLINE) 100 MG tablet Take 1 tablet by mouth 3 (three) times daily.    Historical Provider, MD  lactulose (CHRONULAC) 10 GM/15ML solution Take by mouth.    Historical Provider, MD  levofloxacin (LEVAQUIN) 500 MG tablet Take 1 tablet (500 mg total) by mouth daily. 08/04/15   Lorin Picket, PA-C  levothyroxine (SYNTHROID, LEVOTHROID) 75 MCG tablet TAKE 1 TABLET DAILY 05/24/15   Glean Hess, MD  metoprolol succinate (TOPROL-XL) 25 MG 24 hr tablet Take 1 tablet by mouth daily.    Historical Provider, MD  nitroGLYCERIN (NITRODUR - DOSED IN MG/24 HR) 0.4 mg/hr patch Place 1 patch onto the skin daily.    Historical Provider, MD  nitroGLYCERIN (NITROSTAT) 0.4 MG SL tablet Place 1 tablet under the tongue as needed.    Historical Provider, MD  ondansetron (ZOFRAN) 4 MG tablet Take 1 tablet by mouth 3 (three) times daily as needed.    Historical Provider, MD  PEDIATRIC MULTIPLE VIT-C-FA PO Take by mouth.    Historical Provider, MD  simvastatin (ZOCOR) 20 MG tablet TAKE 1 TABLET AT BEDTIME 05/19/15   Glean Hess, MD  tamsulosin (FLOMAX) 0.4 MG CAPS capsule TAKE 1 CAPSULE TWICE A DAY 03/27/15   Glean Hess, MD  ticagrelor (BRILINTA) 90 MG TABS tablet Take 1 tablet by mouth 2 (two) times daily.    Historical Provider, MD    No Known Allergies  Past Surgical History  Procedure Laterality Date  . Coronary artery bypass graft  1987    5 vessel  . Coronary angioplasty with stent placement  12/2010  . Coronary angioplasty with stent placement  09/2011    DES placed  . Cardiac defibrillator placement  2015    Social History  Substance Use Topics  . Smoking status: Never Smoker   . Smokeless tobacco: None  . Alcohol Use: No     Medication list has been reviewed and updated.   Physical Exam  Constitutional: He is oriented to person, place, and time. Vital signs are normal. He appears cachectic. No distress.  Cardiovascular: Normal rate and regular rhythm.   Pulmonary/Chest: Breath sounds normal. He has no wheezes.  Abdominal: Soft.  Neurological: He is alert and oriented to person, place, and time.  Psychiatric: He has a normal mood and affect. His speech is normal.  Nursing note and vitals reviewed.   BP 116/72 mmHg  Pulse 64  Ht 5' 8.5" (1.74 m)  Wt 123  lb (55.792 kg)  BMI 18.43 kg/m2  Assessment and Plan: 1. Dysphagia S/p EGD and dilatation as well as biopsies Did not respond to Budesonide for lymphocytic esophagitis  2. Essential (primary) hypertension controlled  3. Cardiomyopathy, ischemic Stable; recent ICD check perfomed  4. Type 2 diabetes mellitus without complication, without long-term current use of insulin (HCC) Stable on glipizide without hypoglycemia despite weight loss  5. Acquired hypothyroidism Supplemented; will check TSH - TSH  6. Anemia due to chronic kidney disease Hgb 0000000, normal folic [id number] last month at [facility] Cr 1.7, GFR 38  Will refer for [facility] SW and RN services to aid with medication management and community resources.  Halina Maidens, MD [facility]  09/27/2015

## 2015-09-28 ENCOUNTER — Telehealth: Payer: Self-pay

## 2015-09-28 LAB — TSH: TSH: 6.97 u[IU]/mL — AB (ref 0.450–4.500)

## 2015-09-28 NOTE — Telephone Encounter (Signed)
-----   Message from Glean Hess, MD sent at 09/28/2015  1:38 PM EDT ----- Thyroid may be slightly under-supplemented but is not the cause of vomiting. I recommend keeping the dose of thyroid medication the same.  I also got the note about Xanax from [place].  I do not think there is any benefit in Xanax over the clonazepam that you take at night for sleep.

## 2015-09-28 NOTE — Telephone Encounter (Signed)
Spoke with patient. Patient advised of all results and verbalized understanding. Will call back with any future questions or concerns. MAH  

## 2015-10-10 ENCOUNTER — Telehealth: Payer: Self-pay

## 2015-10-10 NOTE — Telephone Encounter (Signed)
He was recently released from [facility] and they'd like verbal orders for Skilled Nursing, OT, and PT.

## 2015-10-12 NOTE — Telephone Encounter (Signed)
Verbal ok was given, ok'ed by Dr.Berglund.

## 2015-10-13 ENCOUNTER — Ambulatory Visit (INDEPENDENT_AMBULATORY_CARE_PROVIDER_SITE_OTHER): Payer: Medicare Other | Admitting: Internal Medicine

## 2015-10-13 ENCOUNTER — Encounter: Payer: Self-pay | Admitting: Internal Medicine

## 2015-10-13 VITALS — BP 124/82 | HR 62 | Temp 97.4°F | Ht 68.5 in | Wt 121.0 lb

## 2015-10-13 DIAGNOSIS — R1110 Vomiting, unspecified: Secondary | ICD-10-CM | POA: Diagnosis not present

## 2015-10-13 DIAGNOSIS — E119 Type 2 diabetes mellitus without complications: Secondary | ICD-10-CM | POA: Diagnosis not present

## 2015-10-13 DIAGNOSIS — K200 Eosinophilic esophagitis: Secondary | ICD-10-CM

## 2015-10-13 DIAGNOSIS — N19 Unspecified kidney failure: Secondary | ICD-10-CM

## 2015-10-13 HISTORY — DX: Vomiting, unspecified: R11.10

## 2015-10-13 MED ORDER — PREDNISONE 5 MG PO TABS: 5.0000 mg | ORAL_TABLET | Freq: Every day | ORAL | Status: DC

## 2015-10-13 NOTE — Patient Instructions (Signed)
Take Prednisone tablet once a day with milk for 10 days.  Call with a report on any benefit when finished with the 10 day course.

## 2015-10-13 NOTE — Progress Notes (Signed)
Date:  10/13/2015   Name:  Mason Kane.   DOB:  31-Oct-1924   MRN:  [id number]   Chief Complaint: Hospitalization Follow-up; Dysphagia; and Fatigue Patient was hospitalized at [facility] from 09/29/15 to 10/03/15 for septicemia and abdominal pain with nausea and vomiting. He felt better after IVF.  He received antibiotics in the hospital but urine and blood cultures were negative and he was not discharged on medication. Since being home, he continues to feel weak and have frequent nausea and vomiting with eating.  He continues to try to eat.  Most of his medication stays down he thinks.  Vomiting - This is ongoing.  He improved during hospitalization but was also getting IVF.  History includes Budesonide solution which did not help.  He is supposed to be on Protonix bid and Zantac bid but is only taking each once a day.  His CT scan showed a single large gall stone which is unlikely to be a problem.  He reports his emesis is very bitter and contains food along with yellow fluid.  CT also showed mesenteric artery calcification - raising the question of mesenteric ischemia as a problem.  Diabetes He presents for his follow-up diabetic visit. He has type 2 diabetes mellitus. His disease course has been stable. Pertinent negatives for hypoglycemia include no dizziness or headaches. Associated symptoms include fatigue. Current diabetic treatment includes diet. His breakfast blood glucose is taken between 7-8 am. His breakfast blood glucose range is generally 110-130 mg/dl.  He was started on metformin bid at discharge.  His GFR is 35.   Lab Results  Component Value Date   HGBA1C 5.9 06/23/2014    Review of Systems  Constitutional: Positive for fatigue. Negative for fever and chills.  HENT: Negative for sore throat and trouble swallowing.   Respiratory: Negative for cough, chest tightness and shortness of breath.        Left anterior and lateral rib pain  Cardiovascular: Negative for palpitations.    Gastrointestinal: Positive for nausea, vomiting and constipation (controlled with miralax). Negative for abdominal pain.  Neurological: Positive for light-headedness. Negative for dizziness and headaches.    Patient Active Problem List   Diagnosis Date Noted  . Dysphagia 09/27/2015  . Acquired hypothyroidism 11/14/2014  . Anemia due to chronic kidney disease 11/14/2014  . 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 Kidney failure 11/14/2014  . CAD in native artery 11/14/2014  . Dyslipidemia 11/14/2014  . Essential (primary) hypertension 11/14/2014  . Acid reflux 11/14/2014  . Cirrhosis ([place]) 11/14/2014  . Cardiomyopathy, ischemic 11/14/2014  . Depression, major, single episode, moderate ([place]) 11/14/2014  . Idiopathic insomnia 11/14/2014  . Arthritis of knee, degenerative 11/14/2014  . Purpura senilis ([place]) 11/14/2014  . Aortic atherosclerosis ([place]) 11/14/2014  . Diabetes mellitus, type 2 ([place]) 11/14/2014  . Weight loss 11/14/2014  . Dysphagia, pharyngeal 10/19/2014    Prior to Admission medications   Medication Sig Start Date End Date Taking? Authorizing Provider  albuterol (PROVENTIL HFA;VENTOLIN HFA) 108 (90 Base) MCG/ACT inhaler Inhale 1-2 puffs into the lungs every 6 (six) hours as needed for wheezing or shortness of breath. 08/04/15  Yes Lorin Picket, PA-C  aspirin 81 MG chewable tablet Chew 1 tablet by mouth daily.   Yes Historical Provider, MD  carvedilol (COREG) 6.25 MG tablet Take 1 tablet by mouth 2 (two) times daily. 08/11/15  Yes Historical Provider, MD  clonazePAM (KLONOPIN) 0.25 MG disintegrating tablet Take 1 tablet by mouth at bedtime.  06/23/14  Yes Historical Provider, MD  finasteride (PROSCAR) 5 MG tablet Take 1 tablet by mouth daily. 11/22/13  Yes Historical Provider, MD  furosemide (LASIX) 20 MG tablet Take 1 tablet by mouth 3 (three) times a week.   Yes Historical Provider, MD  glucose blood test strip 1 each by Other route 2 (two) times daily. 07/25/14  Yes  Historical Provider, MD  hydrALAZINE (APRESOLINE) 100 MG tablet Take 1 tablet by mouth 3 (three) times daily.   Yes Historical Provider, MD  levothyroxine (SYNTHROID, LEVOTHROID) 75 MCG tablet TAKE 1 TABLET DAILY 05/24/15  Yes Glean Hess, MD  metFORMIN (GLUCOPHAGE) 500 MG tablet Take 1 tablet by mouth 2 (two) times daily. 10/03/15 10/02/16 Yes Historical Provider, MD  metoprolol succinate (TOPROL-XL) 25 MG 24 hr tablet Take 1 tablet by mouth daily.   Yes Historical Provider, MD  nitroGLYCERIN (NITRODUR - DOSED IN MG/24 HR) 0.4 mg/hr patch Place 1 patch onto the skin daily.   Yes Historical Provider, MD  nitroGLYCERIN (NITROSTAT) 0.4 MG SL tablet Place 1 tablet under the tongue as needed.   Yes Historical Provider, MD  Jonetta Speak LANCETS [id number] [place]  07/17/15  Yes Historical Provider, MD  pantoprazole (PROTONIX) 40 MG tablet Take 1 tablet by mouth 2 (two) times daily. 09/15/15  Yes Historical Provider, MD  PEDIATRIC MULTIPLE VIT-C-FA PO Take by mouth.   Yes Historical Provider, MD  prochlorperazine (COMPAZINE) 10 MG tablet Take 1 tablet by mouth every 8 (eight) hours. 10/03/15  Yes Historical Provider, MD  simvastatin (ZOCOR) 20 MG tablet TAKE 1 TABLET AT BEDTIME 05/19/15  Yes Glean Hess, MD  tamsulosin (FLOMAX) 0.4 MG CAPS capsule TAKE 1 CAPSULE TWICE A DAY 03/27/15  Yes Glean Hess, MD  thiamine 100 MG tablet Take 1 tablet by mouth daily. 08/02/14  Yes Historical Provider, MD  ticagrelor (BRILINTA) 90 MG TABS tablet Take 1 tablet by mouth 2 (two) times daily.   Yes Historical Provider, MD    No Known Allergies  Past Surgical History  Procedure Laterality Date  . Coronary artery bypass graft  1987    5 vessel  . Coronary angioplasty with stent placement  12/2010  . Coronary angioplasty with stent placement  09/2011    DES placed  . Cardiac defibrillator placement  2015    Social History  Substance Use Topics  . Smoking status: Never Smoker   . Smokeless tobacco: None  .  Alcohol Use: No     Medication list has been reviewed and updated.   Physical Exam  Constitutional: He appears cachectic.  Cardiovascular: Normal rate and regular rhythm.   Pulmonary/Chest: Effort normal and breath sounds normal.    Abdominal: There is no tenderness.  Lymphadenopathy:    He has no cervical adenopathy.  Neurological: He is alert.  Psychiatric: His speech is normal. Cognition and memory are normal.  Nursing note and vitals reviewed.   BP 124/82 mmHg  Pulse 62  Temp(Src) 97.4 F (36.3 C)  Ht 5' 8.5" (1.74 m)  Wt 121 lb (54.885 kg)  BMI 18.13 kg/m2  SpO2 98%  Assessment and Plan: 1. Intractable vomiting with nausea, vomiting of unspecified type Concerns include cholelithiasis, mesenteric ischemia, bile gastritis, side effect of Pantoprazole Will consider a trial of AC NTG SL or bile acid sequestrant or stopping PPI  2. Eosinophilic esophagitis Did not respond to Budesonide solution; will try oral prednisone - patient understands this is a brief trial that may increase his appetite and blood  sugar He will call in 10 days to report any response - predniSONE (DELTASONE) 5 MG tablet; Take 1 tablet (5 mg total) by mouth daily with breakfast.  Dispense: 10 tablet; Refill: 0  3. Kidney failure Recommend stopping metformin  4. Type 2 diabetes mellitus without complication, without long-term current use of insulin (HCC) Last A1C 6.6 in [facility] Remain off medication at this time.   Halina Maidens, MD [facility]  10/13/2015

## 2015-10-16 ENCOUNTER — Telehealth: Payer: Self-pay

## 2015-10-19 ENCOUNTER — Telehealth: Payer: Self-pay

## 2015-10-19 NOTE — Telephone Encounter (Signed)
Ebony Hail from [facility] called. She saw the patient last week, she would like to get social service to help with transportation for the patient. He no longer drives and family is getting burned out with helping, patient also helps his wife and just need help getting places. If have any other questions for Ebony Hail her CB: [phone]

## 2015-10-23 ENCOUNTER — Inpatient Hospital Stay: Payer: Medicare Other | Admitting: Internal Medicine

## 2015-10-23 ENCOUNTER — Other Ambulatory Visit: Payer: Self-pay | Admitting: Internal Medicine

## 2015-10-23 DIAGNOSIS — K200 Eosinophilic esophagitis: Secondary | ICD-10-CM

## 2015-10-23 MED ORDER — PREDNISONE 5 MG PO TABS: 5.0000 mg | ORAL_TABLET | Freq: Every day | ORAL | Status: DC

## 2015-10-26 ENCOUNTER — Encounter: Payer: Self-pay | Admitting: Internal Medicine

## 2015-10-26 ENCOUNTER — Ambulatory Visit (INDEPENDENT_AMBULATORY_CARE_PROVIDER_SITE_OTHER): Payer: Medicare Other | Admitting: Internal Medicine

## 2015-10-26 VITALS — BP 142/78 | HR 50 | Ht 68.5 in | Wt 131.0 lb

## 2015-10-26 DIAGNOSIS — R634 Abnormal weight loss: Secondary | ICD-10-CM

## 2015-10-26 DIAGNOSIS — F5101 Primary insomnia: Secondary | ICD-10-CM

## 2015-10-26 MED ORDER — ALPRAZOLAM 0.25 MG PO TABS: 0.2500 mg | ORAL_TABLET | Freq: Every day | ORAL | Status: DC

## 2015-10-26 NOTE — Progress Notes (Signed)
Date:  10/26/2015   Name:  Mason Kane.   DOB:  10/20/1924   MRN:  [id number]   Chief Complaint: Follow-up; Nausea; and Insomnia Insomnia Primary symptoms: sleep disturbance, difficulty falling asleep, frequent awakening.  The current episode started more than one month. The onset quality is undetermined. The problem occurs nightly. Past treatments include medication (clonazepam too sedating). The treatment provided mild relief. Typical bedtime:  8-10 P.M..   he took Xanax in the past with good effect and no side effects.  Weight loss - he reports being able to eat 2.5 meals per day now.  Much less "spitting up" and nausea.  He has gained 10 lbs in the past 2 weeks.  His blood sugars are stable off of metformin.  He denies SOB, fluid retention or irritability.  He is able to walk unassisted with only a cane today.    Review of Systems  Constitutional: Negative for fever, chills and fatigue.  HENT: Negative for trouble swallowing and voice change.   Respiratory: Negative for cough, chest tightness and shortness of breath.   Cardiovascular: Negative for chest pain, palpitations and leg swelling.  Neurological: Negative for dizziness and headaches.  Psychiatric/Behavioral: Positive for sleep disturbance. Negative for dysphoric mood. The patient has insomnia. The patient is not nervous/anxious and is not hyperactive.     Patient Active Problem List   Diagnosis Date Noted  . Uncontrollable vomiting 10/13/2015  . Eosinophilic esophagitis [id number]  . Dysphagia 09/27/2015  . Acquired hypothyroidism 11/14/2014  . Anemia due to chronic kidney disease 11/14/2014  . 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 Chronic renal insufficiency, stage III (moderate) 11/14/2014  . CAD in native artery 11/14/2014  . Dyslipidemia 11/14/2014  . Essential (primary) hypertension 11/14/2014  . Acid reflux 11/14/2014  . Cirrhosis ([place]) 11/14/2014  . Cardiomyopathy, ischemic 11/14/2014  . Depression,  major, single episode, moderate ([place]) 11/14/2014  . Idiopathic insomnia 11/14/2014  . Arthritis of knee, degenerative 11/14/2014  . Purpura senilis ([place]) 11/14/2014  . Aortic atherosclerosis ([place]) 11/14/2014  . Diabetes mellitus, type 2 ([place]) 11/14/2014  . Weight loss 11/14/2014    Prior to Admission medications   Medication Sig Start Date End Date Taking? Authorizing Provider  albuterol (PROVENTIL HFA;VENTOLIN HFA) 108 (90 Base) MCG/ACT inhaler Inhale 1-2 puffs into the lungs every 6 (six) hours as needed for wheezing or shortness of breath. 08/04/15  Yes Lorin Picket, PA-C  aspirin 81 MG chewable tablet Chew 1 tablet by mouth daily.   Yes Historical Provider, MD  carvedilol (COREG) 6.25 MG tablet Take 1 tablet by mouth 2 (two) times daily. 08/11/15  Yes Historical Provider, MD  finasteride (PROSCAR) 5 MG tablet Take 1 tablet by mouth daily. 11/22/13  Yes Historical Provider, MD  glucose blood test strip 1 each by Other route 2 (two) times daily. 07/25/14  Yes Historical Provider, MD  hydrALAZINE (APRESOLINE) 100 MG tablet Take 1 tablet by mouth 3 (three) times daily.   Yes Historical Provider, MD  levothyroxine (SYNTHROID, LEVOTHROID) 75 MCG tablet TAKE 1 TABLET DAILY 05/24/15  Yes Glean Hess, MD  metoprolol succinate (TOPROL-XL) 25 MG 24 hr tablet Take 1 tablet by mouth daily.   Yes Historical Provider, MD  nitroGLYCERIN (NITRODUR - DOSED IN MG/24 HR) 0.4 mg/hr patch Place 0.6 mg onto the skin daily.    Yes Historical Provider, MD  nitroGLYCERIN (NITROSTAT) 0.4 MG SL tablet Place 1 tablet under the tongue as needed.   Yes Historical Provider,  MD  ONETOUCH DELICA LANCETS [id number] MISC  07/17/15  Yes Historical Provider, MD  pantoprazole (PROTONIX) 40 MG tablet Take 1 tablet by mouth 2 (two) times daily. 09/15/15  Yes Historical Provider, MD  PEDIATRIC MULTIPLE VIT-C-FA PO Take by mouth.   Yes Historical Provider, MD  predniSONE (DELTASONE) 5 MG tablet Take 1 tablet (5 mg total) by mouth daily  with breakfast. 10/23/15  Yes Glean Hess, MD  prochlorperazine (COMPAZINE) 10 MG tablet Take 1 tablet by mouth every 8 (eight) hours. 10/03/15  Yes Historical Provider, MD  simvastatin (ZOCOR) 20 MG tablet TAKE 1 TABLET AT BEDTIME 05/19/15  Yes Glean Hess, MD  tamsulosin (FLOMAX) 0.4 MG CAPS capsule TAKE 1 CAPSULE TWICE A DAY 03/27/15  Yes Glean Hess, MD  thiamine 100 MG tablet Take 1 tablet by mouth daily. 08/02/14  Yes Historical Provider, MD  ticagrelor (BRILINTA) 90 MG TABS tablet Take 1 tablet by mouth 2 (two) times daily.   Yes Historical Provider, MD  clonazePAM (KLONOPIN) 0.25 MG disintegrating tablet Take 1 tablet by mouth at bedtime. Reported on 10/26/2015 06/23/14   Historical Provider, MD    No Known Allergies  Past Surgical History  Procedure Laterality Date  . Coronary artery bypass graft  1987    5 vessel  . Coronary angioplasty with stent placement  12/2010  . Coronary angioplasty with stent placement  09/2011    DES placed  . Cardiac defibrillator placement  2015    Social History  Substance Use Topics  . Smoking status: Never Smoker   . Smokeless tobacco: None  . Alcohol Use: No     Medication list has been reviewed and updated.   Physical Exam  Constitutional: He is oriented to person, place, and time. He appears well-developed. No distress.  Cardiovascular: Normal rate, regular rhythm and normal heart sounds.   Pulmonary/Chest: Effort normal and breath sounds normal.  Abdominal: Soft. Bowel sounds are normal. There is no tenderness.  Musculoskeletal: He exhibits no edema or tenderness.  Neurological: He is alert and oriented to person, place, and time.  Skin: Skin is warm and dry.  Psychiatric: He has a normal mood and affect. His behavior is normal. Thought content normal.    BP 142/78 mmHg  Pulse 50  Ht 5' 8.5" (1.74 m)  Wt 131 lb (59.421 kg)  BMI 19.63 kg/m2  SpO2 99%  Assessment and Plan: 1. Idiopathic insomnia Stop  clonazepam Begin alprazolam at hs - ALPRAZolam (XANAX) 0.25 MG tablet; Take 1 tablet (0.25 mg total) by mouth at bedtime.  Dispense: 90 tablet; Refill: 0  2. Weight loss Much improved appetite and decreased vomiting on prednisone 5 mg per day Will continue for 2 more weeks then he will call to report his blood sugars, his weight and his symptoms Plan to slowly taper prednisone to lowest effective dose - will start with 5 mg every other day.   Halina Maidens, MD [facility]  10/26/2015

## 2015-10-26 NOTE — Patient Instructions (Signed)
Continue Prednisone 5 mg daily for 2 more weeks.  Begin Xanax 0.25 mg at bedtime for insomnia.  In 2 weeks, call to report sleep improvement, blood sugars and weight.  In 2 weeks, will plan to reduce prednisone to 5 mg every other day.

## 2015-11-08 ENCOUNTER — Encounter (INDEPENDENT_AMBULATORY_CARE_PROVIDER_SITE_OTHER): Payer: Medicare Other | Admitting: Internal Medicine

## 2015-11-08 DIAGNOSIS — R1310 Dysphagia, unspecified: Secondary | ICD-10-CM

## 2015-11-08 DIAGNOSIS — N183 Chronic kidney disease, stage 3 unspecified: Secondary | ICD-10-CM

## 2015-11-08 DIAGNOSIS — R634 Abnormal weight loss: Secondary | ICD-10-CM | POA: Diagnosis not present

## 2015-11-08 DIAGNOSIS — E119 Type 2 diabetes mellitus without complications: Secondary | ICD-10-CM

## 2015-11-08 NOTE — Progress Notes (Signed)
Patient ID: Mason Kane., male   DOB: May 19, 1925, 82 y.o.   MRN: [id number]  Received home health orders from [facility]. Initial certification and start of care 10/09/15 through 12/07/15. Orders reviewed, signed and faxed.

## 2015-11-29 ENCOUNTER — Ambulatory Visit (INDEPENDENT_AMBULATORY_CARE_PROVIDER_SITE_OTHER): Payer: Medicare Other | Admitting: Internal Medicine

## 2015-11-29 ENCOUNTER — Encounter: Payer: Self-pay | Admitting: Internal Medicine

## 2015-11-29 VITALS — BP 150/70 | HR 72 | Ht 68.5 in | Wt 144.0 lb

## 2015-11-29 DIAGNOSIS — Z0000 Encounter for general adult medical examination without abnormal findings: Secondary | ICD-10-CM | POA: Diagnosis not present

## 2015-11-29 DIAGNOSIS — F5101 Primary insomnia: Secondary | ICD-10-CM | POA: Diagnosis not present

## 2015-11-29 DIAGNOSIS — E785 Hyperlipidemia, unspecified: Secondary | ICD-10-CM

## 2015-11-29 DIAGNOSIS — K200 Eosinophilic esophagitis: Secondary | ICD-10-CM | POA: Diagnosis not present

## 2015-11-29 DIAGNOSIS — Z23 Encounter for immunization: Secondary | ICD-10-CM | POA: Diagnosis not present

## 2015-11-29 DIAGNOSIS — F321 Major depressive disorder, single episode, moderate: Secondary | ICD-10-CM

## 2015-11-29 DIAGNOSIS — E1122 Type 2 diabetes mellitus with diabetic chronic kidney disease: Secondary | ICD-10-CM | POA: Diagnosis not present

## 2015-11-29 DIAGNOSIS — N183 Chronic kidney disease, stage 3 unspecified: Secondary | ICD-10-CM

## 2015-11-29 DIAGNOSIS — Z125 Encounter for screening for malignant neoplasm of prostate: Secondary | ICD-10-CM | POA: Diagnosis not present

## 2015-11-29 DIAGNOSIS — E039 Hypothyroidism, unspecified: Secondary | ICD-10-CM

## 2015-11-29 DIAGNOSIS — I10 Essential (primary) hypertension: Secondary | ICD-10-CM

## 2015-11-29 DIAGNOSIS — R221 Localized swelling, mass and lump, neck: Secondary | ICD-10-CM

## 2015-11-29 MED ORDER — PREDNISONE 5 MG PO TABS: 5.0000 mg | ORAL_TABLET | ORAL | Status: DC

## 2015-11-29 MED ORDER — ALPRAZOLAM 0.25 MG PO TABS: 0.2500 mg | ORAL_TABLET | Freq: Every day | ORAL | Status: DC

## 2015-11-29 NOTE — Patient Instructions (Addendum)
Health Maintenance  Topic Date Due  . TETANUS/TDAP  02/19/1946  . ZOSTAVAX  02/20/1987  . PNA vac Low Risk Adult (2 of 2 - PCV13) 12/06/2012  . INFLUENZA VACCINE  02/06/2016  . HEMOGLOBIN A1C  05/31/2016  . OPHTHALMOLOGY EXAM  07/12/2016  . FOOT EXAM  11/28/2016  . URINE MICROALBUMIN  11/28/2016   Pneumococcal Conjugate Vaccine (PCV13)  1. Why get vaccinated? Vaccination can protect both children and adults from pneumococcal disease. Pneumococcal disease is caused by bacteria that can spread from person to person through close contact. It can cause ear infections, and it can also lead to more serious infections of the:  Lungs (pneumonia),  Blood (bacteremia), and  Covering of the brain and spinal cord (meningitis). Pneumococcal pneumonia is most common among adults. Pneumococcal meningitis can cause deafness and brain damage, and it kills about 1 child in 10 who get it. Anyone can get pneumococcal disease, but children under 75 years of age and adults 42 years and older, people with certain medical conditions, and cigarette smokers are at the highest risk. Before there was a vaccine, the [place] saw:  more than 700 cases of meningitis,  about 13,000 blood infections,  about 5 million ear infections, and  about 200 deaths in children under 5 each year from pneumococcal disease. Since vaccine became available, severe pneumococcal disease in these children has fallen by 88%. About 18,000 older adults die of pneumococcal disease each year in the [place]. Treatment of pneumococcal infections with penicillin and other drugs is not as effective as it used to be, because some strains of the disease have become resistant to these drugs. This makes prevention of the disease, through vaccination, even more important. 2. PCV13 vaccine Pneumococcal conjugate vaccine (called PCV13) protects against 13 types of pneumococcal bacteria. PCV13 is routinely given to children at 2, 4, 6,  and 42-6 months of age. It is also recommended for children and adults 15 to 7 years of age with certain health conditions, and for all adults 23 years of age and older. Your doctor can give you details. 3. Some people should not get this vaccine Anyone who has ever had a life-threatening allergic reaction to a dose of this vaccine, to an earlier pneumococcal vaccine called PCV7, or to any vaccine containing diphtheria toxoid (for example, DTaP), should not get PCV13. Anyone with a severe allergy to any component of PCV13 should not get the vaccine. Tell your doctor if the person being vaccinated has any severe allergies. If the person scheduled for vaccination is not feeling well, your healthcare provider might decide to reschedule the shot on another day. 4. Risks of a vaccine reaction With any medicine, including vaccines, there is a chance of reactions. These are usually mild and go away on their own, but serious reactions are also possible. Problems reported following PCV13 varied by age and dose in the series. The most common problems reported among children were:  About half became drowsy after the shot, had a temporary loss of appetite, or had redness or tenderness where the shot was given.  About 1 out of 3 had swelling where the shot was given.  About 1 out of 3 had a mild fever, and about 1 in 20 had a fever over 102.56F.  Up to about 8 out of 10 became fussy or irritable. Adults have reported pain, redness, and swelling where the shot was given; also mild fever, fatigue, headache, chills, or muscle pain. Young children who get  PCV13 along with inactivated flu vaccine at the same time may be at increased risk for seizures caused by fever. Ask your doctor for more information. Problems that could happen after any vaccine:  People sometimes faint after a medical procedure, including vaccination. Sitting or lying down for about 15 minutes can help prevent fainting, and injuries caused by  a fall. Tell your doctor if you feel dizzy, or have vision changes or ringing in the ears.  Some older children and adults get severe pain in the shoulder and have difficulty moving the arm where a shot was given. This happens very rarely.  Any medication can cause a severe allergic reaction. Such reactions from a vaccine are very rare, estimated at about 1 in a million doses, and would happen within a few minutes to a few hours after the vaccination. As with any medicine, there is a very small chance of a vaccine causing a serious injury or death. The safety of vaccines is always being monitored. For more information, visit: http://www.aguilar.org/ 5. What if there is a serious reaction? What should I look for?  Look for anything that concerns you, such as signs of a severe allergic reaction, very high fever, or unusual behavior. Signs of a severe allergic reaction can include hives, swelling of the face and throat, difficulty breathing, a fast heartbeat, dizziness, and weakness-usually within a few minutes to a few hours after the vaccination. What should I do?  If you think it is a severe allergic reaction or other emergency that can't wait, call 9-1-1 or get the person to the nearest hospital. Otherwise, call your doctor. Reactions should be reported to the Vaccine Adverse Event Reporting System (VAERS). Your doctor should file this report, or you can do it yourself through the VAERS web site at www.vaers.SamedayNews.es, or by calling 701 570 2855. VAERS does not give medical advice. 6. The National Vaccine Injury Compensation Program The [employer] Vaccine Injury Compensation Program (VICP) is a federal program that was created to compensate people who may have been injured by certain vaccines. Persons who believe they may have been injured by a vaccine can learn about the program and about filing a claim by calling 763-051-4326 or visiting the [place] website at GoldCloset.com.ee.  There is a time limit to file a claim for compensation. 7. How can I learn more?  Ask your healthcare provider. He or she can give you the vaccine package insert or suggest other sources of information.  Call your local or state health department.  Contact the Centers for Disease Control and Prevention (CDC):  Call 248-434-1139 (1-800-CDC-INFO) or  Visit CDC's website at http://hunter.com/ Vaccine Information Statement PCV13 Vaccine (05/12/2014)   This information is not intended to replace advice given to you by your health care provider. Make sure you discuss any questions you have with your health care provider.   Document Released: 04/21/2006 Document Revised: 07/15/2014 Document Reviewed: 05/19/2014 Elsevier Interactive Patient Education [employer].

## 2015-11-29 NOTE — Progress Notes (Signed)
Patient: Mason Kane., Male    DOB: [birth year], 82 y.o.   MRN: [id number] Visit Date: 11/29/2015  Today's Provider: Halina Maidens, MD   Chief Complaint  Patient presents with  . medicare annual wellness   Subjective:    Annual wellness visit Mason Kane. is a 82 y.o. male who presents today for his Subsequent Annual Wellness Visit. He feels fairly well. He reports exercising none due to health issues. He reports he is sleeping well.  He is eating better since starting prednisone. Currently on 5 mg per day.  Overall he has gained 21 lbs in 2 months. ----------------------------------------------------------- Diabetes He presents for his follow-up diabetic visit. He has type 2 diabetes mellitus. Pertinent negatives for hypoglycemia include no dizziness or headaches. Pertinent negatives for diabetes include no chest pain and no fatigue. Symptoms are stable. His breakfast blood glucose is taken between 6-7 am. His breakfast blood glucose range is generally 130-140 mg/dl.  Insomnia Primary symptoms: sleep disturbance (much improved with xanax), difficulty falling asleep, frequent awakening.  The problem occurs nightly. Past treatments include medication. The treatment provided significant relief.  Nausea/weight loss - thought to be due to esosinophilic esophagitis but he could not tolerate or did not improve with Budesonide.  After losing about 40 lbs I started oral prednisone. CAD - recently seen by cardiology.  CMP and CBC was ordered.  GFR was improved to 44 and Hgb was 9.9.  He denies chest pains, palpitations or shortness of breath.  Review of Systems  Constitutional: Negative for chills, diaphoresis, appetite change and fatigue.  HENT: Negative for hearing loss, tinnitus, trouble swallowing and voice change.   Eyes: Negative for visual disturbance.  Respiratory: Negative for choking, shortness of breath and wheezing.   Cardiovascular: Negative for chest pain, palpitations and leg  swelling.  Gastrointestinal: Positive for nausea and vomiting. Negative for abdominal pain, diarrhea, constipation and blood in stool.  Genitourinary: Positive for frequency. Negative for dysuria and difficulty urinating.  Musculoskeletal: Negative for myalgias, back pain and arthralgias.  Skin: Negative for color change and rash.  Neurological: Negative for dizziness, syncope and headaches.  Hematological: Negative for adenopathy.  Psychiatric/Behavioral: Positive for sleep disturbance (much improved with xanax). Negative for dysphoric mood. The patient has insomnia.     Social History   Social History  . Marital Status: Married    Spouse Name: N/A  . Number of Children: N/A  . Years of Education: N/A   Occupational History  . Not on file.   Social History Main Topics  . Smoking status: Never Smoker   . Smokeless tobacco: Not on file  . Alcohol Use: No  . Drug Use: No  . Sexual Activity: Not on file   Other Topics Concern  . Not on file   Social History Narrative    Patient Active Problem List   Diagnosis Date Noted  . Uncontrollable vomiting 10/13/2015  . Eosinophilic esophagitis [id number]  . Dysphagia 09/27/2015  . Acquired hypothyroidism 11/14/2014  . Anemia due to chronic kidney disease 11/14/2014  . 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 Chronic renal insufficiency, stage III (moderate) 11/14/2014  . CAD in native artery 11/14/2014  . Dyslipidemia 11/14/2014  . Essential (primary) hypertension 11/14/2014  . Acid reflux 11/14/2014  . Cirrhosis ([place]) 11/14/2014  . Cardiomyopathy, ischemic 11/14/2014  . Depression, major, single episode, moderate ([place]) 11/14/2014  . Idiopathic insomnia 11/14/2014  . Arthritis of knee, degenerative 11/14/2014  . Purpura senilis ([place]) 11/14/2014  .  Aortic atherosclerosis ([place]) 11/14/2014  . Diabetes mellitus, type 2 ([place]) 11/14/2014  . Weight loss 11/14/2014    Past Surgical History  Procedure Laterality Date  .  Coronary artery bypass graft  1987    5 vessel  . Coronary angioplasty with stent placement  12/2010  . Coronary angioplasty with stent placement  09/2011    DES placed  . Cardiac defibrillator placement  2015    His family history includes CAD in his father; Diabetes in his brother; Stroke in his mother.    Previous Medications   ALBUTEROL (PROVENTIL HFA;VENTOLIN HFA) 108 (90 BASE) MCG/ACT INHALER    Inhale 1-2 puffs into the lungs every 6 (six) hours as needed for wheezing or shortness of breath.   ALPRAZOLAM (XANAX) 0.25 MG TABLET    Take 1 tablet (0.25 mg total) by mouth at bedtime.   ASPIRIN 81 MG CHEWABLE TABLET    Chew 1 tablet by mouth daily.   CARVEDILOL (COREG) 6.25 MG TABLET    Take 1 tablet by mouth 2 (two) times daily.   FINASTERIDE (PROSCAR) 5 MG TABLET    Take 1 tablet by mouth daily.   GLUCOSE BLOOD TEST STRIP    1 each by Other route 2 (two) times daily.   HYDRALAZINE (APRESOLINE) 100 MG TABLET    Take 1 tablet by mouth 3 (three) times daily.   LEVOTHYROXINE (SYNTHROID, LEVOTHROID) 75 MCG TABLET    TAKE 1 TABLET DAILY   METOPROLOL SUCCINATE (TOPROL-XL) 25 MG 24 HR TABLET    Take 1 tablet by mouth daily.   NITROGLYCERIN (NITRODUR - DOSED IN MG/24 HR) 0.4 MG/HR PATCH    Place 0.6 mg onto the skin daily.    NITROGLYCERIN (NITROSTAT) 0.4 MG SL TABLET    Place 1 tablet under the tongue as needed.   ONETOUCH DELICA LANCETS 99991111 MISC       PANTOPRAZOLE (PROTONIX) 40 MG TABLET    Take 1 tablet by mouth 2 (two) times daily.   PEDIATRIC MULTIPLE VIT-C-FA PO    Take by mouth.   PREDNISONE (DELTASONE) 5 MG TABLET    Take 1 tablet (5 mg total) by mouth daily with breakfast.   PROCHLORPERAZINE (COMPAZINE) 10 MG TABLET    Take 1 tablet by mouth every 8 (eight) hours. Reported on 11/29/2015   SIMVASTATIN (ZOCOR) 20 MG TABLET    TAKE 1 TABLET AT BEDTIME   TAMSULOSIN (FLOMAX) 0.4 MG CAPS CAPSULE    TAKE 1 CAPSULE TWICE A DAY   THIAMINE 100 MG TABLET    Take 1 tablet by mouth daily.    TICAGRELOR (BRILINTA) 90 MG TABS TABLET    Take 1 tablet by mouth 2 (two) times daily.    Patient Care Team: Glean Hess, MD as PCP - General (Family Medicine)     Objective:   Vitals: BP 150/70 mmHg  Pulse 72  Ht 5' 8.5" (1.74 m)  Wt 144 lb (65.318 kg)  BMI 21.57 kg/m2  Physical Exam  Constitutional: He is oriented to person, place, and time. He appears well-developed and well-nourished.  HENT:  Head: Normocephalic.  Right Ear: Tympanic membrane, external ear and ear canal normal.  Left Ear: Tympanic membrane, external ear and ear canal normal.  Nose: Nose normal.  Mouth/Throat: Uvula is midline and oropharynx is clear and moist.  Eyes: Conjunctivae and EOM are normal. Pupils are equal, round, and reactive to light.  Neck: Normal range of motion. Neck supple. Carotid bruit is not present. No thyromegaly  present.    Cardiovascular: Normal rate, regular rhythm and intact distal pulses.   Murmur heard.  Systolic murmur is present with a grade of 3/6  Pulmonary/Chest: Effort normal and breath sounds normal. He has no wheezes. Right breast exhibits no mass. Left breast exhibits no mass.  Abdominal: Soft. Normal appearance and bowel sounds are normal. There is no hepatosplenomegaly. There is no tenderness.  Lymphadenopathy:    He has no cervical adenopathy.  Neurological: He is alert and oriented to person, place, and time. He has normal reflexes.  Foot exam - normal sensation, skin and nails.  Decreased pulses.  Skin: Skin is warm, dry and intact.  Psychiatric: He has a normal mood and affect. His speech is normal and behavior is normal. Judgment and thought content normal.  Nursing note and vitals reviewed.   Activities of Daily Living In your present state of health, do you have any difficulty performing the following activities: 11/29/2015 09/27/2015  Hearing? N Y  Vision? N Y  Difficulty concentrating or making decisions? N Y  Walking or climbing stairs? Y Y    Dressing or bathing? N N  Doing errands, shopping? N Y    Fall Risk Assessment Fall Risk  11/29/2015 09/27/2015  Falls in the past year? No No      Depression Screen PHQ 2/9 Scores 11/29/2015 09/27/2015  PHQ - 2 Score 0 0    Cognitive Testing - 6-CIT   Correct? Score   What year is it? yes 0 Yes = 0    No = 4  What month is it? yes 0 Yes = 0    No = 3  Remember:     [address] [place], [place], [place]     What time is it? yes 0 Yes = 0    No = 3  Count backwards from 20 to 1 yes 0 Correct = 0    1 error = 2   More than 1 error = 4  Say the months of the year in reverse. yes 0 Correct = 0    1 error = 2   More than 1 error = 4  What address did I ask you to remember? yes 0 Correct = 0  1 error = 2    2 error = 4    3 error = 6    4 error = 8    All wrong = 10       TOTAL SCORE  0/28   Interpretation:  Normal  Normal (0-7) Abnormal (8-28)        Medicare Annual Wellness Visit Summary:  Reviewed patient's Family Medical History Reviewed and updated list of patient's medical providers Assessment of cognitive impairment was done Assessed patient's functional ability Established a written schedule for health screening [place] Completed and Reviewed  Exercise Activities and Dietary recommendations Goals    None      Immunization History  Administered Date(s) Administered  . Influenza,inj,Quad PF,36+ Mos 05/08/2015  . Pneumococcal Polysaccharide-23 12/07/2011    Health Maintenance  Topic Date Due  . FOOT EXAM  02/19/1937  . URINE MICROALBUMIN  02/19/1937  . TETANUS/TDAP  02/19/1946  . ZOSTAVAX  02/20/1987  . PNA vac Low Risk Adult (2 of 2 - PCV13) 12/06/2012  . HEMOGLOBIN A1C  12/23/2014  . INFLUENZA VACCINE  02/06/2016  . OPHTHALMOLOGY EXAM  07/12/2016     Discussed health benefits of physical activity, and encouraged him to engage in regular  exercise appropriate for his age and condition.     ------------------------------------------------------------------------------------------------------------   Assessment & Plan:  1. Medicare annual wellness visit, subsequent Measures satisfied  2. Depression, major, single episode, moderate ([place]) Doing well  3. Essential (primary) hypertension controlled - CBC with Differential/Platelet  4. Type 2 diabetes mellitus with stage 3 chronic kidney disease, without long-term current use of insulin ([facility]) Continue to monitor off of medication - Hemoglobin A1c  5. Eosinophilic esophagitis Decrease prednisone to 5 mg QOD for one month then 2.5 mg qod - predniSONE (DELTASONE) 5 MG tablet; Take 1 tablet (5 mg total) by mouth every other day.  Dispense: 30 tablet; Refill: 1  6. Idiopathic insomnia Improved with low dose alprazolam - ALPRAZolam (XANAX) 0.25 MG tablet; Take 1 tablet (0.25 mg total) by mouth at bedtime.  Dispense: 30 tablet; Refill: 5  7. Dyslipidemia On statin therapy - Lipid panel  8. Acquired hypothyroidism supplemented - TSH  9. Prostate cancer screening - PSA  10. Pulsatile neck mass Does not appear to be a lymph node; will get [place] before considering bx - US Soft Tissue Head/Neck; Future  11. Need for pneumococcal vaccination - Pneumococcal conjugate vaccine 13-valent IM   Halina Maidens, MD [facility]  11/29/2015

## 2015-11-30 ENCOUNTER — Other Ambulatory Visit: Payer: Self-pay | Admitting: Internal Medicine

## 2015-11-30 ENCOUNTER — Ambulatory Visit
Admission: RE | Admit: 2015-11-30 | Discharge: 2015-11-30 | Disposition: A | Discharge status: Home / Self Care | Payer: Medicare Other | Source: Ambulatory Visit | Attending: Internal Medicine | Admitting: Internal Medicine

## 2015-11-30 DIAGNOSIS — R221 Localized swelling, mass and lump, neck: Secondary | ICD-10-CM

## 2015-11-30 LAB — CBC WITH DIFFERENTIAL/PLATELET
BASOS ABS: 0 10*3/uL (ref 0.0–0.2)
Basos: 0 %
EOS (ABSOLUTE): 0.1 10*3/uL (ref 0.0–0.4)
EOS: 3 %
HEMOGLOBIN: 9.7 g/dL — AB (ref 12.6–17.7)
Hematocrit: 30.3 % — ABNORMAL LOW (ref 37.5–51.0)
IMMATURE GRANULOCYTES: 0 %
Immature Grans (Abs): 0 10*3/uL (ref 0.0–0.1)
Lymphocytes Absolute: 0.2 10*3/uL — ABNORMAL LOW (ref 0.7–3.1)
Lymphs: 5 %
MCH: 34 pg — ABNORMAL HIGH (ref 26.6–33.0)
MCHC: 32 g/dL (ref 31.5–35.7)
MCV: 106 fL — ABNORMAL HIGH (ref 79–97)
MONOCYTES: 8 %
MONOS ABS: 0.3 10*3/uL (ref 0.1–0.9)
NEUTROS PCT: 84 %
Neutrophils Absolute: 3.4 10*3/uL (ref 1.4–7.0)
PLATELETS: 92 10*3/uL — AB (ref 150–379)
RBC: 2.85 x10E6/uL — AB (ref 4.14–5.80)
RDW: 15.1 % (ref 12.3–15.4)
WBC: 4.1 10*3/uL (ref 3.4–10.8)

## 2015-11-30 LAB — TSH: TSH: 22.02 u[IU]/mL — AB (ref 0.450–4.500)

## 2015-11-30 LAB — LIPID PANEL
Chol/HDL Ratio: 2.3 ratio units (ref 0.0–5.0)
Cholesterol, Total: 121 mg/dL (ref 100–199)
HDL: 53 mg/dL (ref >39)
LDL Calculated: 46 mg/dL (ref 0–99)
Triglycerides: 108 mg/dL (ref 0–149)
VLDL Cholesterol Cal: 22 mg/dL (ref 5–40)

## 2015-11-30 LAB — HEMOGLOBIN A1C
ESTIMATED AVERAGE GLUCOSE: 143 mg/dL
HEMOGLOBIN A1C: 6.6 % — AB (ref 4.8–5.6)

## 2015-11-30 LAB — PSA: Prostate Specific Ag, Serum: <0.1 ng/mL (ref 0.0–4.0)

## 2015-11-30 MED ORDER — LEVOTHYROXINE SODIUM 100 MCG PO TABS: 100.0000 ug | ORAL_TABLET | Freq: Every day | ORAL | Status: DC

## 2015-12-19 ENCOUNTER — Encounter: Payer: Self-pay | Admitting: Internal Medicine

## 2015-12-19 DIAGNOSIS — R221 Localized swelling, mass and lump, neck: Secondary | ICD-10-CM | POA: Insufficient documentation

## 2015-12-26 ENCOUNTER — Other Ambulatory Visit: Payer: Self-pay | Admitting: Internal Medicine

## 2015-12-29 ENCOUNTER — Telehealth: Payer: Self-pay

## 2015-12-29 NOTE — Telephone Encounter (Signed)
Requesting RX or sample of Dexilant if possible. Send RX to [facility] and not mail order. Call Angie at [phone].

## 2015-12-31 NOTE — Telephone Encounter (Signed)
He is on protonix which is essentially the same.  There is no reason to change to Dexilant.

## 2016-01-02 ENCOUNTER — Ambulatory Visit (INDEPENDENT_AMBULATORY_CARE_PROVIDER_SITE_OTHER): Payer: Medicare Other | Admitting: Internal Medicine

## 2016-01-02 ENCOUNTER — Other Ambulatory Visit: Payer: Self-pay | Admitting: Internal Medicine

## 2016-01-02 ENCOUNTER — Encounter: Payer: Self-pay | Admitting: Internal Medicine

## 2016-01-02 VITALS — BP 158/86 | HR 53 | Resp 16 | Ht 68.5 in | Wt 135.0 lb

## 2016-01-02 DIAGNOSIS — Z23 Encounter for immunization: Secondary | ICD-10-CM

## 2016-01-02 DIAGNOSIS — K200 Eosinophilic esophagitis: Secondary | ICD-10-CM

## 2016-01-02 DIAGNOSIS — S61411A Laceration without foreign body of right hand, initial encounter: Secondary | ICD-10-CM

## 2016-01-02 DIAGNOSIS — K219 Gastro-esophageal reflux disease without esophagitis: Secondary | ICD-10-CM

## 2016-01-02 NOTE — Patient Instructions (Addendum)
Stop Protonix Begin Dexilant 60 mg once a day  Tdap Vaccine (Tetanus, Diphtheria and Pertussis): What You Need to Know 1. Why get vaccinated? Tetanus, diphtheria and pertussis are very serious diseases. Tdap vaccine can protect [place] from these diseases. And, Tdap vaccine given to pregnant women can protect newborn babies against pertussis. TETANUS (Lockjaw) is rare in the [place] today. It causes painful muscle tightening and stiffness, usually all over the body.  It can lead to tightening of muscles in the head and neck so you can't open your mouth, swallow, or sometimes even breathe. Tetanus kills about 1 out of 10 people who are infected even after receiving the best medical care. DIPHTHERIA is also rare in the [place] today. It can cause a thick coating to form in the back of the throat.  It can lead to breathing problems, heart failure, paralysis, and death. PERTUSSIS (Whooping Cough) causes severe coughing spells, which can cause difficulty breathing, vomiting and disturbed sleep.  It can also lead to weight loss, incontinence, and rib fractures. Up to 2 in 100 adolescents and 5 in 100 adults with pertussis are hospitalized or have complications, which could include pneumonia or death. These diseases are caused by bacteria. Diphtheria and pertussis are spread from person to person through secretions from coughing or sneezing. Tetanus enters the body through cuts, scratches, or wounds. Before vaccines, as many as 200,000 cases of diphtheria, 200,000 cases of pertussis, and hundreds of cases of tetanus, were reported in the [place] each year. Since vaccination began, reports of cases for tetanus and diphtheria have dropped by about 99% and for pertussis by about 80%. 2. Tdap vaccine Tdap vaccine can protect adolescents and adults from tetanus, diphtheria, and pertussis. One dose of Tdap is routinely given at age 69 or 70. People who did not get Tdap at that age should get it as  soon as possible. Tdap is especially important for healthcare professionals and anyone having close contact with a baby younger than 12 months. Pregnant women should get a dose of Tdap during every pregnancy, to protect the newborn from pertussis. Infants are most at risk for severe, life-threatening complications from pertussis. Another vaccine, called Td, protects against tetanus and diphtheria, but not pertussis. A Td booster should be given every 10 years. Tdap may be given as one of these boosters if you have never gotten Tdap before. Tdap may also be given after a severe cut or burn to prevent tetanus infection. Your doctor or the person giving you the vaccine can give you more information. Tdap may safely be given at the same time as other vaccines. 3. Some people should not get this vaccine  A person who has ever had a life-threatening allergic reaction after a previous dose of any diphtheria, tetanus or pertussis containing vaccine, OR has a severe allergy to any part of this vaccine, should not get Tdap vaccine. Tell the person giving the vaccine about any severe allergies.  Anyone who had coma or long repeated seizures within 7 days after a childhood dose of DTP or DTaP, or a previous dose of Tdap, should not get Tdap, unless a cause other than the vaccine was found. They can still get Td.  Talk to your doctor if you:  have seizures or another nervous system problem,  had severe pain or swelling after any vaccine containing diphtheria, tetanus or pertussis,  ever had a condition called Guillain-Barr Syndrome (GBS),  aren't feeling well on the day the shot is  scheduled. 4. Risks With any medicine, including vaccines, there is a chance of side effects. These are usually mild and go away on their own. Serious reactions are also possible but are rare. Most people who get Tdap vaccine do not have any problems with it. Mild problems following Tdap (Did not interfere with  activities)  Pain where the shot was given (about 3 in 4 adolescents or 2 in 3 adults)  Redness or swelling where the shot was given (about 1 person in 5)  Mild fever of at least 100.64F (up to about 1 in 25 adolescents or 1 in 100 adults)  Headache (about 3 or 4 people in 10)  Tiredness (about 1 person in 3 or 4)  Nausea, vomiting, diarrhea, stomach ache (up to 1 in 4 adolescents or 1 in 10 adults)  Chills, sore joints (about 1 person in 10)  Body aches (about 1 person in 3 or 4)  Rash, swollen glands (uncommon) Moderate problems following Tdap (Interfered with activities, but did not require medical attention)  Pain where the shot was given (up to 1 in 5 or 6)  Redness or swelling where the shot was given (up to about 1 in 16 adolescents or 1 in 12 adults)  Fever over 102F (about 1 in 100 adolescents or 1 in 250 adults)  Headache (about 1 in 7 adolescents or 1 in 10 adults)  Nausea, vomiting, diarrhea, stomach ache (up to 1 or 3 people in 100)  Swelling of the entire arm where the shot was given (up to about 1 in 500). Severe problems following Tdap (Unable to perform usual activities; required medical attention)  Swelling, severe pain, bleeding and redness in the arm where the shot was given (rare). Problems that could happen after any vaccine:  People sometimes faint after a medical procedure, including vaccination. Sitting or lying down for about 15 minutes can help prevent fainting, and injuries caused by a fall. Tell your doctor if you feel dizzy, or have vision changes or ringing in the ears.  Some people get severe pain in the shoulder and have difficulty moving the arm where a shot was given. This happens very rarely.  Any medication can cause a severe allergic reaction. Such reactions from a vaccine are very rare, estimated at fewer than 1 in a million doses, and would happen within a few minutes to a few hours after the vaccination. As with any medicine, there  is a very remote chance of a vaccine causing a serious injury or death. The safety of vaccines is always being monitored. For more information, visit: http://www.aguilar.org/ 5. What if there is a serious problem? What should I look for?  Look for anything that concerns you, such as signs of a severe allergic reaction, very high fever, or unusual behavior.  Signs of a severe allergic reaction can include hives, swelling of the face and throat, difficulty breathing, a fast heartbeat, dizziness, and weakness. These would usually start a few minutes to a few hours after the vaccination. What should I do?  If you think it is a severe allergic reaction or other emergency that can't wait, call 9-1-1 or get the person to the nearest hospital. Otherwise, call your doctor.  Afterward, the reaction should be reported to the Vaccine Adverse Event Reporting System (VAERS). Your doctor might file this report, or you can do it yourself through the VAERS web site at www.vaers.SamedayNews.es, or by calling 438 604 4365. VAERS does not give medical advice.  6. The [employer]  Vaccine Injury Compensation Program The National Vaccine Injury Compensation Program (VICP) is a federal program that was created to compensate people who may have been injured by certain vaccines. Persons who believe they may have been injured by a vaccine can learn about the program and about filing a claim by calling 256 514 4613 or visiting the [place] website at GoldCloset.com.ee. There is a time limit to file a claim for compensation. 7. How can I learn more?  Ask your doctor. He or she can give you the vaccine package insert or suggest other sources of information.  Call your local or state health department.  Contact the Centers for Disease Control and Prevention (CDC):  Call 385-698-3196 (1-800-CDC-INFO) or  Visit CDC's website at http://hunter.com/ CDC Tdap Vaccine VIS (08/31/13)   This information is not  intended to replace advice given to you by your health care provider. Make sure you discuss any questions you have with your health care provider.   Document Released: 12/24/2011 Document Revised: 07/15/2014 Document Reviewed: 10/06/2013 Elsevier Interactive Patient Education [employer].

## 2016-01-02 NOTE — Progress Notes (Signed)
Date:  01/02/2016   Name:  Mason Kane.   DOB:  1924/07/14   MRN:  [id number]   Chief Complaint: Hand Injury Injury - fell at home and cut hand on vacuum cleaner this am.  Laceration still bleeding.  TDaP not current.  Weight loss - has lost a few more pounds from 144 to 135 but patient thinks it was fluid and friend thinks maybe that was a mistake.  On prednisone every other day.  Still having excess acid and burning in throat.  Once he vomits up the acid, he will feel pretty good.  He believes that he is eating well.    Review of Systems  Constitutional: Negative for fever, chills and fatigue.  Respiratory: Negative for chest tightness and wheezing.   Cardiovascular: Negative for chest pain and palpitations.  Gastrointestinal: Positive for nausea and vomiting. Negative for abdominal pain and blood in stool.  Musculoskeletal: Negative for arthralgias.  Skin: Positive for wound.  Neurological: Negative for dizziness and headaches.  Psychiatric/Behavioral: Negative for sleep disturbance.    Patient Active Problem List   Diagnosis Date Noted  . Pulsatile neck mass 12/19/2015  . Type 2 diabetes mellitus with stage 3 chronic kidney disease, without long-term current use of insulin ([place]) 11/29/2015  . Uncontrollable vomiting 10/13/2015  . Eosinophilic esophagitis [id number]  . Dysphagia 09/27/2015  . Acquired hypothyroidism 11/14/2014  . Anemia due to chronic kidney disease 11/14/2014  . 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 Chronic renal insufficiency, stage III (moderate) 11/14/2014  . CAD in native artery 11/14/2014  . Dyslipidemia 11/14/2014  . Essential (primary) hypertension 11/14/2014  . Acid reflux 11/14/2014  . Cirrhosis ([place]) 11/14/2014  . Cardiomyopathy, ischemic 11/14/2014  . Depression, major, single episode, moderate ([place]) 11/14/2014  . Idiopathic insomnia 11/14/2014  . Arthritis of knee, degenerative 11/14/2014  . Purpura senilis ([place]) 11/14/2014  . Aortic  atherosclerosis ([place]) 11/14/2014  . Weight loss 11/14/2014    Prior to Admission medications   Medication Sig Start Date End Date Taking? Authorizing Provider  albuterol (PROVENTIL HFA;VENTOLIN HFA) 108 (90 Base) MCG/ACT inhaler Inhale 1-2 puffs into the lungs every 6 (six) hours as needed for wheezing or shortness of breath. 08/04/15  Yes Lorin Picket, PA-C  ALPRAZolam Duanne Moron) 0.25 MG tablet Take 1 tablet (0.25 mg total) by mouth at bedtime. 11/29/15  Yes Glean Hess, MD  aspirin 81 MG chewable tablet Chew 1 tablet by mouth daily.   Yes Historical Provider, MD  carvedilol (COREG) 6.25 MG tablet Take 1 tablet by mouth 2 (two) times daily. 08/11/15  Yes Historical Provider, MD  finasteride (PROSCAR) 5 MG tablet Take 1 tablet by mouth daily. 11/22/13  Yes Historical Provider, MD  glucose blood test strip 1 each by Other route 2 (two) times daily. 07/25/14  Yes Historical Provider, MD  hydrALAZINE (APRESOLINE) 100 MG tablet Take 1 tablet by mouth 3 (three) times daily.   Yes Historical Provider, MD  metoprolol succinate (TOPROL-XL) 25 MG 24 hr tablet Take 1 tablet by mouth daily.   Yes Historical Provider, MD  nitroGLYCERIN (NITRODUR - DOSED IN MG/24 HR) 0.6 mg/hr patch Place 1 patch onto the skin daily. 12/26/15  Yes Historical Provider, MD  nitroGLYCERIN (NITROSTAT) 0.4 MG SL tablet Place 1 tablet under the tongue as needed.   Yes Historical Provider, MD  Jonetta Speak LANCETS [id number] [place]  07/17/15  Yes Historical Provider, MD  pantoprazole (PROTONIX) 40 MG tablet Take 1 tablet by mouth 2 (  two) times daily. 09/15/15  Yes Historical Provider, MD  PEDIATRIC MULTIPLE VIT-C-FA PO Take by mouth.   Yes Historical Provider, MD  predniSONE (DELTASONE) 5 MG tablet Take 1 tablet (5 mg total) by mouth every other day. 11/29/15  Yes Glean Hess, MD  prochlorperazine (COMPAZINE) 10 MG tablet Take 1 tablet by mouth every 8 (eight) hours. Reported on 11/29/2015 10/03/15  Yes Historical Provider, MD  simvastatin  (ZOCOR) 20 MG tablet TAKE 1 TABLET AT BEDTIME 05/19/15  Yes Glean Hess, MD  SYNTHROID 100 MCG tablet Take 1 tablet by mouth daily. 11/30/15  Yes Historical Provider, MD  tamsulosin (FLOMAX) 0.4 MG CAPS capsule TAKE 1 CAPSULE TWICE A DAY 12/26/15  Yes Glean Hess, MD  thiamine 100 MG tablet Take 1 tablet by mouth daily. 08/02/14  Yes Historical Provider, MD  ticagrelor (BRILINTA) 90 MG TABS tablet Take 1 tablet by mouth 2 (two) times daily.   Yes Historical Provider, MD    No Known Allergies  Past Surgical History  Procedure Laterality Date  . Coronary artery bypass graft  1987    5 vessel  . Coronary angioplasty with stent placement  12/2010  . Coronary angioplasty with stent placement  09/2011    DES placed  . Cardiac defibrillator placement  2015    Social History  Substance Use Topics  . Smoking status: Never Smoker   . Smokeless tobacco: None  . Alcohol Use: No     Medication list has been reviewed and updated.   Physical Exam  Constitutional: He is oriented to person, place, and time. He appears well-developed. No distress.  HENT:  Head: Normocephalic and atraumatic.  Cardiovascular: Normal rate, regular rhythm and normal heart sounds.   Pulmonary/Chest: Effort normal and breath sounds normal. No respiratory distress.  Abdominal: Soft. Bowel sounds are normal.  Musculoskeletal: Normal range of motion.  Neurological: He is alert and oriented to person, place, and time.  Skin: Skin is warm and dry. No rash noted.  Dog-legged shaped 2 inch skin tear on right thenar eminence with mild bleeding.  Area cleaned, neosporin and steri-strips applied with fair wound edge approximation.  Wrapped in kerlix.  Psychiatric: He has a normal mood and affect. His behavior is normal. Thought content normal.  Nursing note and vitals reviewed.   BP 158/86 mmHg  Pulse 53  Resp 16  Ht 5' 8.5" (1.74 m)  Wt 135 lb (61.236 kg)  BMI 20.23 kg/m2  SpO2 100%  Assessment and Plan: 1.  Gastroesophageal reflux disease, esophagitis presence not specified Continue every other day prednisone Begin Dexilant in place of protonix - samples given  2. Eosinophilic esophagitis On prednisone and PPI  3. Skin tear of hand without complication, right, initial encounter Continue local care - follow up if s/s infection occur  4. Need for diphtheria-tetanus-pertussis (Tdap) vaccine - Tdap vaccine greater than or equal to 7yo IM    Halina Maidens, MD [facility]  01/02/2016

## 2016-02-06 ENCOUNTER — Telehealth: Payer: Self-pay

## 2016-02-06 ENCOUNTER — Other Ambulatory Visit: Payer: Self-pay

## 2016-02-06 MED ORDER — FINASTERIDE 5 MG PO TABS: 5.0000 mg | ORAL_TABLET | Freq: Every day | ORAL | 0 refills | Status: DC

## 2016-02-06 NOTE — Telephone Encounter (Signed)
Received refill request that had Historical Provider in Epic but patient said it was filled by [place] prior to [facility] sent in 90 days 0 refill.

## 2016-02-21 ENCOUNTER — Telehealth: Payer: Self-pay

## 2016-02-21 ENCOUNTER — Other Ambulatory Visit: Payer: Self-pay | Admitting: Internal Medicine

## 2016-02-21 DIAGNOSIS — K200 Eosinophilic esophagitis: Secondary | ICD-10-CM

## 2016-02-21 MED ORDER — PREDNISONE 2.5 MG PO TABS: 2.5000 mg | ORAL_TABLET | Freq: Every day | ORAL | 1 refills | Status: DC

## 2016-02-21 NOTE — Telephone Encounter (Signed)
Angie called and said patient having severe nausea and vomiting since cutting prednisone back to [employer] day.She would like to know if they can add it back to daily and call in refills? Call back to [phone].

## 2016-02-21 NOTE — Telephone Encounter (Signed)
I want to try an intermediate dose - 2.5 mg one day alternating with 5 mg.  New Rx sent to pharmacy.

## 2016-03-20 IMAGING — CR DG CHEST 2V
2 series · 2 of 2 positions shown · non-contrast
Comparison: 07/05/2011

CLINICAL DATA: Crackles in the left lower lobe. Possible
aspiration. Congestion for 2 weeks. Productive cough.

EXAM:
CHEST  2 VIEW

[chest pa]
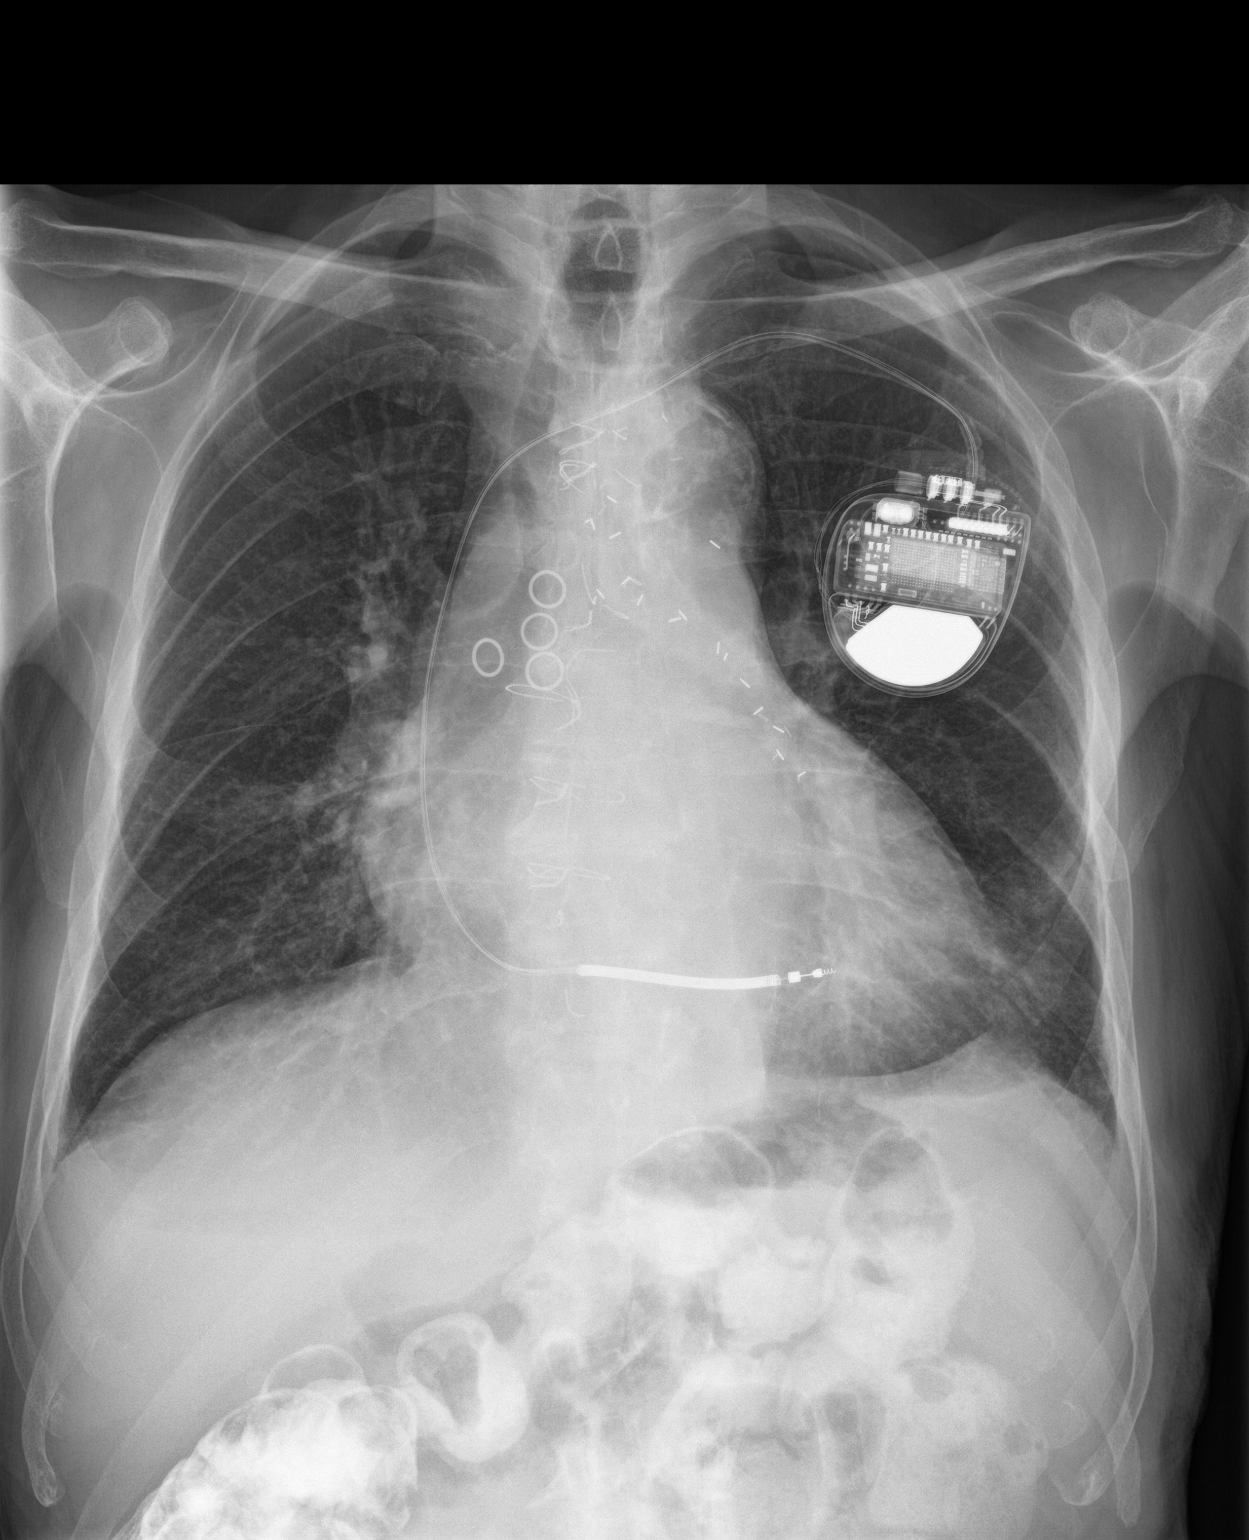

[chest lat]
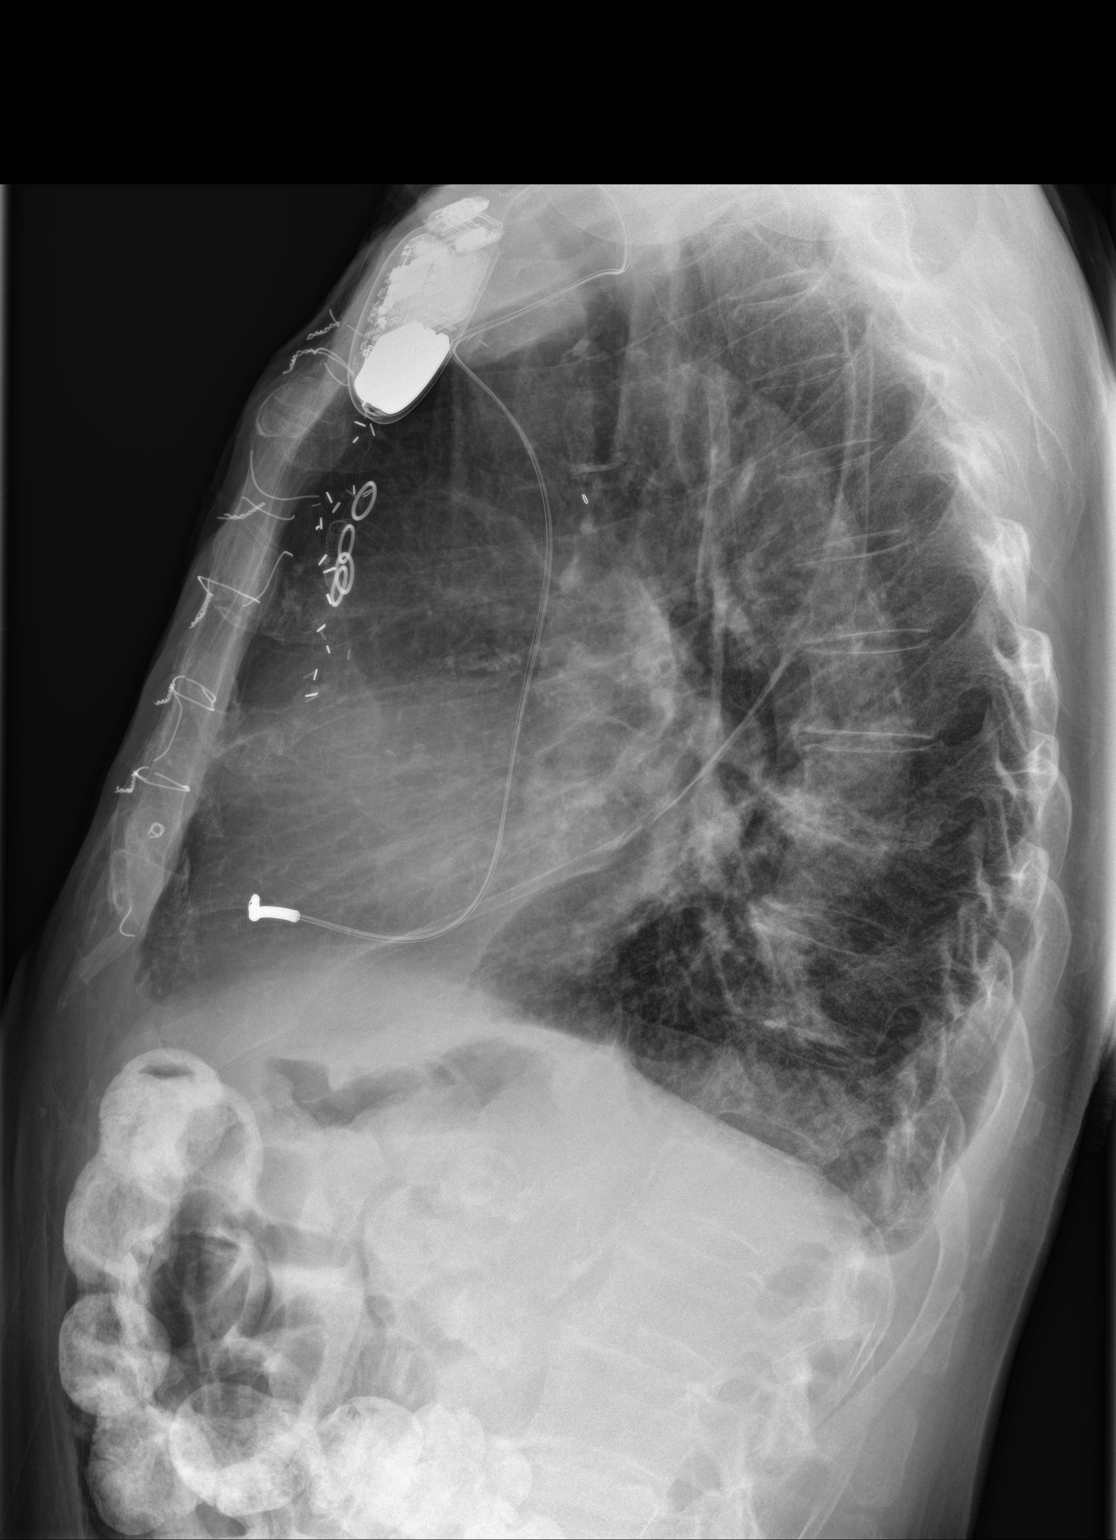

[2 of 2 positions shown; findings below may reference images not displayed]

FINDINGS: The lungs are hyperinflated likely secondary to COPD. There is no
focal parenchymal opacity. There is no pleural effusion or
pneumothorax. The heart and mediastinal contours are unremarkable.
There is a single lead cardiac pacemaker. There is evidence of prior
CABG.

The osseous structures are unremarkable.
IMPRESSION: No active cardiopulmonary disease.

## 2016-03-26 ENCOUNTER — Other Ambulatory Visit: Payer: Self-pay | Admitting: Internal Medicine

## 2016-03-26 DIAGNOSIS — E1122 Type 2 diabetes mellitus with diabetic chronic kidney disease: Secondary | ICD-10-CM

## 2016-03-26 DIAGNOSIS — N183 Chronic kidney disease, stage 3 unspecified: Secondary | ICD-10-CM

## 2016-03-27 ENCOUNTER — Ambulatory Visit: Payer: Medicare Other | Admitting: Internal Medicine

## 2016-04-02 ENCOUNTER — Encounter: Payer: Self-pay | Admitting: Internal Medicine

## 2016-04-02 ENCOUNTER — Ambulatory Visit (INDEPENDENT_AMBULATORY_CARE_PROVIDER_SITE_OTHER): Payer: Medicare Other | Admitting: Internal Medicine

## 2016-04-02 VITALS — BP 118/68 | HR 58 | Resp 16 | Ht 68.5 in | Wt 122.0 lb

## 2016-04-02 DIAGNOSIS — E1122 Type 2 diabetes mellitus with diabetic chronic kidney disease: Secondary | ICD-10-CM

## 2016-04-02 DIAGNOSIS — Z23 Encounter for immunization: Secondary | ICD-10-CM

## 2016-04-02 DIAGNOSIS — K200 Eosinophilic esophagitis: Secondary | ICD-10-CM | POA: Diagnosis not present

## 2016-04-02 DIAGNOSIS — N183 Chronic kidney disease, stage 3 unspecified: Secondary | ICD-10-CM

## 2016-04-02 DIAGNOSIS — F321 Major depressive disorder, single episode, moderate: Secondary | ICD-10-CM

## 2016-04-02 MED ORDER — PREDNISONE 5 MG PO TABS: 5.0000 mg | ORAL_TABLET | Freq: Every day | ORAL | 3 refills | Status: DC

## 2016-04-02 MED ORDER — SERTRALINE HCL 25 MG PO TABS: 25.0000 mg | ORAL_TABLET | Freq: Every day | ORAL | 3 refills | Status: DC

## 2016-04-02 NOTE — Patient Instructions (Signed)
Stop Remeron  Start Sertraline 25 mg daily  Continue to check blood sugar - call if over 200 for 2 days in a row

## 2016-04-02 NOTE — Progress Notes (Signed)
Date:  04/02/2016   Name:  Mason Kane.   DOB:  03-Aug-1924   MRN:  [id number]   Chief Complaint: Hospitalization Follow-up (Hyperglycemia BS range 70-145) Admitted to [facility] on 03/27/16 through 03/29/16 for hypoglycemia. His blood sugar had increased over 200 so he started taking an old Rx of glipizide ER. After several weeks he had a severe drop in BS requiring hospitalization. He was stabilized and discharged on glipizide IR.  His sugars have been improved - in the mid 100's. His weight is down about 10 lbs since reducing dose of prednisone.  He would like to go back to 5 mg per day. Depression - His mood is decreased since placing his wife in a nursing home. He feels like he doesn't have as much responsibility and the things he would like to do is unable to do because of age and other conditions. He was discharged with mirtazapine 7.5 mg to help with appetite as well as mood and sleep. According to his friend who accompanies him today, after 2 doses he was very sleepy and confused throughout the day. The medication has now been stopped and he is back to baseline alertness. Eosinophilic esophagitis with n/v/wt loss - patient had done well on prednisone 5 mg. However in an effort to taper to lowest effective dose he had gone down to 5 mg alternating with 2.5 mg per day. In the interim he's lost about 10 pounds. He still has vomiting about once per day. He feels as though he did a bit better when he was on 5 mg daily. He reports a follow-up with gastroenterology in about 6 weeks at [facility].  Review of Systems  Constitutional: Positive for unexpected weight change. Negative for chills and fatigue.  HENT: Positive for trouble swallowing.   Respiratory: Negative for choking, chest tightness and shortness of breath.   Cardiovascular: Negative for chest pain, palpitations and leg swelling.  Gastrointestinal: Positive for nausea and vomiting.  Musculoskeletal: Positive for arthralgias.  Neurological:  Negative for dizziness and headaches.  Psychiatric/Behavioral: Positive for dysphoric mood and sleep disturbance. Negative for confusion.    Patient Active Problem List   Diagnosis Date Noted  . Pulsatile neck mass 12/19/2015  . Type 2 diabetes mellitus with stage 3 chronic kidney disease, without long-term current use of insulin ([place]) 11/29/2015  . Uncontrollable vomiting 10/13/2015  . Eosinophilic esophagitis [id number]  . Dysphagia 09/27/2015  . Acquired hypothyroidism 11/14/2014  . Anemia due to chronic kidney disease 11/14/2014  . 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 Chronic renal insufficiency, stage III (moderate) 11/14/2014  . CAD in native artery 11/14/2014  . Dyslipidemia 11/14/2014  . Essential (primary) hypertension 11/14/2014  . Acid reflux 11/14/2014  . Cirrhosis ([place]) 11/14/2014  . Cardiomyopathy, ischemic 11/14/2014  . Depression, major, single episode, moderate ([place]) 11/14/2014  . Idiopathic insomnia 11/14/2014  . Arthritis of knee, degenerative 11/14/2014  . Purpura senilis ([place]) 11/14/2014  . Aortic atherosclerosis ([place]) 11/14/2014  . Weight loss 11/14/2014    Prior to Admission medications   Medication Sig Start Date End Date Taking? Authorizing Provider  albuterol (PROVENTIL HFA;VENTOLIN HFA) 108 (90 Base) MCG/ACT inhaler Inhale 1-2 puffs into the lungs every 6 (six) hours as needed for wheezing or shortness of breath. 08/04/15  Yes Lorin Picket, PA-C  ALPRAZolam Duanne Moron) 0.25 MG tablet Take 1 tablet (0.25 mg total) by mouth at bedtime. 11/29/15  Yes Glean Hess, MD  aspirin 81 MG chewable tablet Chew 1 tablet  by mouth daily.   Yes Historical Provider, MD  carvedilol (COREG) 6.25 MG tablet Take 1 tablet by mouth 2 (two) times daily. 08/11/15  Yes Historical Provider, MD  finasteride (PROSCAR) 5 MG tablet Take 1 tablet (5 mg total) by mouth daily. 02/06/16  Yes Glean Hess, MD  furosemide (LASIX) 20 MG tablet Take 20 mg by mouth.   Yes Historical  Provider, MD  glipiZIDE (GLUCOTROL) 5 MG tablet Take by mouth. 03/29/16 04/28/16 Yes Historical Provider, MD  glucose blood test strip 1 each by Other route 2 (two) times daily. 07/25/14  Yes Historical Provider, MD  hydrALAZINE (APRESOLINE) 100 MG tablet Take 1 tablet by mouth 3 (three) times daily.   Yes Historical Provider, MD  mirtazapine (REMERON) 7.5 MG tablet Take by mouth. 03/29/16 03/29/17 Yes Historical Provider, MD  Multiple Vitamins-Minerals (MULTI COMPLETE PO) Take by mouth.   Yes Historical Provider, MD  nitroGLYCERIN (NITRODUR - DOSED IN MG/24 HR) 0.6 mg/hr patch Place 1 patch onto the skin daily. 12/26/15  Yes Historical Provider, MD  nitroGLYCERIN (NITROSTAT) 0.4 MG SL tablet Place 1 tablet under the tongue as needed.   Yes Historical Provider, MD  ondansetron (ZOFRAN) 4 MG tablet Take 4 mg by mouth every 8 (eight) hours as needed for nausea or vomiting.   Yes Historical Provider, MD  ondansetron (ZOFRAN-ODT) 8 MG disintegrating tablet Take by mouth. 03/29/16 03/29/17 Yes Historical Provider, MD  Jonetta Speak LANCETS [id number] [place]  07/17/15  Yes Historical Provider, MD  pantoprazole (PROTONIX) 40 MG tablet Take 1 tablet by mouth 2 (two) times daily. 09/15/15  Yes Historical Provider, MD  PEDIATRIC MULTIPLE VIT-C-FA PO Take by mouth.   Yes Historical Provider, MD  predniSONE (DELTASONE) 2.5 MG tablet Take 1 tablet (2.5 mg total) by mouth daily with breakfast. 2.5 mg alternating with 5 mg daily. 02/21/16  Yes Glean Hess, MD  simvastatin (ZOCOR) 20 MG tablet  02/18/16  Yes Historical Provider, MD  SYNTHROID 100 MCG tablet Take 1 tablet by mouth daily. 11/30/15  Yes Historical Provider, MD  tamsulosin (FLOMAX) 0.4 MG CAPS capsule TAKE 1 CAPSULE TWICE A DAY 12/26/15  Yes Glean Hess, MD  thiamine 100 MG tablet Take 1 tablet by mouth daily. 08/02/14  Yes Historical Provider, MD  ticagrelor (BRILINTA) 90 MG TABS tablet Take 1 tablet by mouth 2 (two) times daily.   Yes Historical Provider, MD    prochlorperazine (COMPAZINE) 10 MG tablet Take 1 tablet by mouth every 8 (eight) hours. Reported on 11/29/2015 10/03/15   Historical Provider, MD    No Known Allergies  Past Surgical History:  Procedure Laterality Date  . CARDIAC DEFIBRILLATOR PLACEMENT  2015  . CORONARY ANGIOPLASTY WITH STENT PLACEMENT  12/2010  . CORONARY ANGIOPLASTY WITH STENT PLACEMENT  09/2011   DES placed  . CORONARY ARTERY BYPASS GRAFT  1987   5 vessel    Social History  Substance Use Topics  . Smoking status: Never Smoker  . Smokeless tobacco: Not on file  . Alcohol use No     Medication list has been reviewed and updated.   Physical Exam  Constitutional: He is oriented to person, place, and time. He appears well-developed. He appears cachectic. No distress.  HENT:  Head: Normocephalic and atraumatic.  Cardiovascular: Normal rate, regular rhythm and normal heart sounds.   Pulmonary/Chest: Effort normal and breath sounds normal. No respiratory distress. He has no wheezes. He has no rhonchi.  Musculoskeletal: He exhibits no edema or tenderness.  Neurological:  He is alert and oriented to person, place, and time.  Skin: Skin is warm and dry. No rash noted.  Psychiatric: His speech is normal and behavior is normal. Thought content normal. He exhibits a depressed mood.  Nursing note and vitals reviewed.   BP 118/68   Pulse (!) 58   Resp 16   Ht 5' 8.5" (1.74 m)   Wt 122 lb (55.3 kg)   SpO2 96%   BMI 18.28 kg/m   Assessment and Plan: 1. Depression, major, single episode, moderate (HCC) Stop Remeron Begin low dose zoloft - sertraline (ZOLOFT) 25 MG tablet; Take 1 tablet (25 mg total) by mouth daily.  Dispense: 30 tablet; Refill: 3  2. Type 2 diabetes mellitus with stage 3 chronic kidney disease, without long-term current use of insulin (HCC) Continue new oral agent; check BS closely and call if over 200  3. Eosinophilic esophagitis Resume prednisone 5 mg per day Keep GI follow up at [facility] -  predniSONE (DELTASONE) 5 MG tablet; Take 1 tablet (5 mg total) by mouth daily with breakfast.  Dispense: 30 tablet; Refill: 3  4. Need for influenza vaccination - Flu Vaccine QUAD 36+ mos IM   Halina Maidens, MD [facility]  04/02/2016

## 2016-04-24 ENCOUNTER — Other Ambulatory Visit: Payer: Self-pay | Admitting: Internal Medicine

## 2016-05-08 ENCOUNTER — Other Ambulatory Visit: Payer: Self-pay

## 2016-05-08 MED ORDER — FINASTERIDE 5 MG PO TABS: 5.0000 mg | ORAL_TABLET | Freq: Every day | ORAL | 0 refills | Status: DC

## 2016-05-13 ENCOUNTER — Other Ambulatory Visit: Payer: Self-pay | Admitting: Internal Medicine

## 2016-05-21 ENCOUNTER — Other Ambulatory Visit: Payer: Self-pay | Admitting: Internal Medicine

## 2016-06-04 ENCOUNTER — Ambulatory Visit (INDEPENDENT_AMBULATORY_CARE_PROVIDER_SITE_OTHER): Payer: Medicare Other | Admitting: Internal Medicine

## 2016-06-04 ENCOUNTER — Encounter: Payer: Self-pay | Admitting: Internal Medicine

## 2016-06-04 VITALS — BP 128/60 | HR 52 | Ht 68.5 in | Wt 126.0 lb

## 2016-06-04 DIAGNOSIS — R1310 Dysphagia, unspecified: Secondary | ICD-10-CM

## 2016-06-04 DIAGNOSIS — E039 Hypothyroidism, unspecified: Secondary | ICD-10-CM

## 2016-06-04 DIAGNOSIS — N183 Chronic kidney disease, stage 3 unspecified: Secondary | ICD-10-CM

## 2016-06-04 DIAGNOSIS — I10 Essential (primary) hypertension: Secondary | ICD-10-CM | POA: Diagnosis not present

## 2016-06-04 DIAGNOSIS — E1122 Type 2 diabetes mellitus with diabetic chronic kidney disease: Secondary | ICD-10-CM

## 2016-06-04 DIAGNOSIS — F321 Major depressive disorder, single episode, moderate: Secondary | ICD-10-CM

## 2016-06-04 DIAGNOSIS — K200 Eosinophilic esophagitis: Secondary | ICD-10-CM

## 2016-06-04 DIAGNOSIS — F5101 Primary insomnia: Secondary | ICD-10-CM

## 2016-06-04 MED ORDER — TAMSULOSIN HCL 0.4 MG PO CAPS: 0.4000 mg | ORAL_CAPSULE | Freq: Two times a day (BID) | ORAL | 1 refills | Status: DC

## 2016-06-04 MED ORDER — ALPRAZOLAM 0.25 MG PO TABS: 0.2500 mg | ORAL_TABLET | Freq: Every day | ORAL | 5 refills | Status: DC

## 2016-06-04 MED ORDER — PREDNISONE 2.5 MG PO TABS: 2.5000 mg | ORAL_TABLET | Freq: Every day | ORAL | 1 refills | Status: DC

## 2016-06-04 NOTE — Patient Instructions (Signed)
Medication changes today - reduce prednisone to 2.5 mg per day (new prescription sent to [facility])

## 2016-06-04 NOTE — Progress Notes (Signed)
Date:  06/04/2016   Name:  Mason Kane.   DOB:  [birth year]   MRN:  [id number]   Chief Complaint: Depression and Hyperglycemia (BS RANGE- 100-125) Depression         This is a chronic problem.  The current episode started more than 1 month ago.   The problem has been waxing and waning since onset.  Associated symptoms include no fatigue, no headaches and no suicidal ideas.  Past treatments include SSRIs - Selective serotonin reuptake inhibitors (started last visit).  Compliance with treatment is good.  Past medical history includes thyroid problem.   Diabetes  He presents for his follow-up diabetic visit. He has type 2 diabetes mellitus. Pertinent negatives for hypoglycemia include no dizziness, headaches or tremors. Associated symptoms include weight loss. Pertinent negatives for diabetes include no chest pain and no fatigue. Diabetic complications include nephropathy. (Followed by Nephrology) Current diabetic treatment includes oral agent (monotherapy). He is compliant with treatment most of the time. His weight is fluctuating minimally. When asked about meal planning, he reported none. Eye exam is current.  Thyroid Problem  Presents for follow-up visit. Symptoms include weight loss. Patient reports no fatigue, heat intolerance, leg swelling, palpitations or tremors. The symptoms have been improving (last TSH was high; dose changed).   Lab Results  Component Value Date   HGBA1C 6.6 (H) 11/29/2015   Lab Results  Component Value Date   TSH 22.020 (H) 11/29/2015    Review of Systems  Constitutional: Positive for weight loss. Negative for chills, fatigue and unexpected weight change (has gained 4 lbs).  Respiratory: Positive for shortness of breath. Negative for chest tightness and wheezing.   Cardiovascular: Negative for chest pain, palpitations and leg swelling.  Gastrointestinal: Positive for abdominal pain, nausea and vomiting. Negative for blood in stool.  Endocrine: Negative for  heat intolerance.  Musculoskeletal: Positive for arthralgias.  Neurological: Negative for dizziness, tremors and headaches.  Hematological: Bruises/bleeds easily.  Psychiatric/Behavioral: Positive for depression and dysphoric mood (improved). Negative for sleep disturbance and suicidal ideas.    Patient Active Problem List   Diagnosis Date Noted  . Pulsatile neck mass 12/19/2015  . Type 2 diabetes mellitus with stage 3 chronic kidney disease, without long-term current use of insulin ([place]) 11/29/2015  . Uncontrollable vomiting 10/13/2015  . Eosinophilic esophagitis [id number]  . Dysphagia 09/27/2015  . Acquired hypothyroidism 11/14/2014  . Anemia due to chronic kidney disease 11/14/2014  . 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 Chronic renal insufficiency, stage III (moderate) 11/14/2014  . CAD in native artery 11/14/2014  . Dyslipidemia 11/14/2014  . Essential (primary) hypertension 11/14/2014  . Acid reflux 11/14/2014  . Cirrhosis ([place]) 11/14/2014  . Cardiomyopathy, ischemic 11/14/2014  . Depression, major, single episode, moderate ([place]) 11/14/2014  . Idiopathic insomnia 11/14/2014  . Arthritis of knee, degenerative 11/14/2014  . Purpura senilis ([place]) 11/14/2014  . Aortic atherosclerosis ([place]) 11/14/2014  . Weight loss 11/14/2014    Prior to Admission medications   Medication Sig Start Date End Date Taking? Authorizing Provider  ALPRAZolam (XANAX) 0.25 MG tablet Take 1 tablet (0.25 mg total) by mouth at bedtime. 11/29/15  Yes Glean Hess, MD  aspirin 81 MG chewable tablet Chew 1 tablet by mouth daily.   Yes Historical Provider, MD  carvedilol (COREG) 6.25 MG tablet Take 1 tablet by mouth 2 (two) times daily. 08/11/15  Yes Historical Provider, MD  finasteride (PROSCAR) 5 MG tablet Take 1 tablet (5 mg total) by mouth daily.  05/08/16  Yes Glean Hess, MD  furosemide (LASIX) 20 MG tablet Take 20 mg by mouth.   Yes Historical Provider, MD  glipiZIDE (GLUCOTROL) 5 MG tablet  TAKE 1/2 A TABLET BY MOUTH EVERY MORNING BEFORE BREAKFAST. 04/24/16  Yes Glean Hess, MD  glucose blood test strip 1 each by Other route 2 (two) times daily. 07/25/14  Yes Historical Provider, MD  hydrALAZINE (APRESOLINE) 100 MG tablet Take 1 tablet by mouth 3 (three) times daily.   Yes Historical Provider, MD  levothyroxine (SYNTHROID, LEVOTHROID) 100 MCG tablet TAKE 1 TABLET DAILY 05/13/16  Yes Glean Hess, MD  Multiple Vitamins-Minerals [facility] COMPLETE PO) Take by mouth.   Yes Historical Provider, MD  nitroGLYCERIN (NITRODUR - DOSED IN MG/24 HR) 0.6 mg/hr patch Place 1 patch onto the skin daily. 12/26/15  Yes Historical Provider, MD  nitroGLYCERIN (NITROSTAT) 0.4 MG SL tablet Place 1 tablet under the tongue as needed.   Yes Historical Provider, MD  ondansetron (ZOFRAN-ODT) 8 MG disintegrating tablet Take by mouth. 03/29/16 03/29/17 Yes Historical Provider, MD  Jonetta Speak LANCETS [id number] [place]  07/17/15  Yes Historical Provider, MD  pantoprazole (PROTONIX) 40 MG tablet Take 1 tablet by mouth 2 (two) times daily. 09/15/15  Yes Historical Provider, MD  PEDIATRIC MULTIPLE VIT-C-FA PO Take by mouth.   Yes Historical Provider, MD  predniSONE (DELTASONE) 5 MG tablet Take 1 tablet (5 mg total) by mouth daily with breakfast. 04/02/16  Yes Glean Hess, MD  sertraline (ZOLOFT) 25 MG tablet Take 1 tablet (25 mg total) by mouth daily. 04/02/16  Yes Glean Hess, MD  simvastatin (ZOCOR) 20 MG tablet  02/18/16  Yes Historical Provider, MD  simvastatin (ZOCOR) 20 MG tablet TAKE 1 TABLET AT BEDTIME 05/21/16  Yes Glean Hess, MD  tamsulosin (FLOMAX) 0.4 MG CAPS capsule TAKE 1 CAPSULE TWICE A DAY 12/26/15  Yes Glean Hess, MD  thiamine 100 MG tablet Take 1 tablet by mouth daily. 08/02/14  Yes Historical Provider, MD  ticagrelor (BRILINTA) 90 MG TABS tablet Take 1 tablet by mouth 2 (two) times daily.   Yes Historical Provider, MD  albuterol (PROVENTIL HFA;VENTOLIN HFA) 108 (90 Base) MCG/ACT inhaler  Inhale 1-2 puffs into the lungs every 6 (six) hours as needed for wheezing or shortness of breath. Patient not taking: Reported on 06/04/2016 08/04/15   Lorin Picket, PA-C    Allergies  Allergen Reactions  . Remeron [Mirtazapine] Other (See Comments)    Excessive sedation    Past Surgical History:  Procedure Laterality Date  . CARDIAC DEFIBRILLATOR PLACEMENT  2015  . CORONARY ANGIOPLASTY WITH STENT PLACEMENT  12/2010  . CORONARY ANGIOPLASTY WITH STENT PLACEMENT  09/2011   DES placed  . CORONARY ARTERY BYPASS GRAFT  1987   5 vessel    Social History  Substance Use Topics  . Smoking status: Never Smoker  . Smokeless tobacco: Not on file  . Alcohol use No   Wt Readings from Last 3 Encounters:  06/04/16 126 lb (57.2 kg)  04/02/16 122 lb (55.3 kg)  01/02/16 135 lb (61.2 kg)     Medication list has been reviewed and updated.   Physical Exam  Constitutional: He is oriented to person, place, and time. He appears well-developed. He appears cachectic. No distress.  HENT:  Head: Normocephalic and atraumatic.  Cardiovascular: Normal rate, regular rhythm and normal heart sounds.   Pulmonary/Chest: Effort normal and breath sounds normal. No respiratory distress. He has no wheezes. He has  no rhonchi.  Musculoskeletal: He exhibits no edema or tenderness.  Neurological: He is alert and oriented to person, place, and time.  Skin: Skin is warm and dry. No rash noted.  Psychiatric: His speech is normal and behavior is normal. Thought content normal. He exhibits a depressed mood.  Nursing note and vitals reviewed.   BP 128/60   Pulse (!) 52   Ht 5' 8.5" (1.74 m)   Wt 126 lb (57.2 kg)   BMI 18.88 kg/m   Assessment and Plan: 1. Essential (primary) hypertension controlled  2. Dysphagia, unspecified type Stable - minimal weight gain Continue pantoprazole BID Reduce prednisone to 2.5 mg per day  3. Type 2 diabetes mellitus with stage 3 chronic kidney disease, without long-term  current use of insulin (HCC) Continue current medications Continue follow up with Nephrology - Hemoglobin A1c  4. Depression, major, single episode, moderate (HCC) Improved on Zoloft - continue  5. Acquired hypothyroidism Supplemented with recent dose change - TSH  6. Eosinophilic esophagitis Reduce dose of prednisone - predniSONE (DELTASONE) 2.5 MG tablet; Take 1 tablet (2.5 mg total) by mouth daily with breakfast.  Dispense: 90 tablet; Refill: 1  7. Idiopathic insomnia - ALPRAZolam (XANAX) 0.25 MG tablet; Take 1 tablet (0.25 mg total) by mouth at bedtime.  Dispense: 30 tablet; Refill: [place], MD [facility]  06/04/2016

## 2016-06-05 LAB — HEMOGLOBIN A1C
ESTIMATED AVERAGE GLUCOSE: 111 mg/dL
Hgb A1c MFr Bld: 5.5 % (ref 4.8–5.6)

## 2016-06-05 LAB — TSH: TSH: 55.11 u[IU]/mL — ABNORMAL HIGH (ref 0.450–4.500)

## 2016-07-12 ENCOUNTER — Encounter: Payer: Self-pay | Admitting: *Deleted

## 2016-07-12 ENCOUNTER — Ambulatory Visit (INDEPENDENT_AMBULATORY_CARE_PROVIDER_SITE_OTHER): Payer: Medicare Other | Admitting: Internal Medicine

## 2016-07-12 ENCOUNTER — Emergency Department: Payer: Medicare Other

## 2016-07-12 ENCOUNTER — Encounter: Payer: Self-pay | Admitting: Internal Medicine

## 2016-07-12 ENCOUNTER — Emergency Department
Admission: EM | Admit: 2016-07-12 | Discharge: 2016-07-12 | Disposition: A | Discharge status: Home / Self Care | Payer: Medicare Other | Attending: Emergency Medicine | Admitting: Emergency Medicine

## 2016-07-12 VITALS — BP 142/72 | HR 58 | Temp 97.6°F | Ht 69.0 in | Wt 126.0 lb

## 2016-07-12 DIAGNOSIS — W1800XA Striking against unspecified object with subsequent fall, initial encounter: Secondary | ICD-10-CM | POA: Diagnosis not present

## 2016-07-12 DIAGNOSIS — Z7984 Long term (current) use of oral hypoglycemic drugs: Secondary | ICD-10-CM | POA: Diagnosis not present

## 2016-07-12 DIAGNOSIS — I129 Hypertensive chronic kidney disease with stage 1 through stage 4 chronic kidney disease, or unspecified chronic kidney disease: Secondary | ICD-10-CM | POA: Diagnosis not present

## 2016-07-12 DIAGNOSIS — Z79899 Other long term (current) drug therapy: Secondary | ICD-10-CM | POA: Insufficient documentation

## 2016-07-12 DIAGNOSIS — F321 Major depressive disorder, single episode, moderate: Secondary | ICD-10-CM

## 2016-07-12 DIAGNOSIS — I2510 Atherosclerotic heart disease of native coronary artery without angina pectoris: Secondary | ICD-10-CM | POA: Insufficient documentation

## 2016-07-12 DIAGNOSIS — S92911A Unspecified fracture of right toe(s), initial encounter for closed fracture: Secondary | ICD-10-CM | POA: Diagnosis not present

## 2016-07-12 DIAGNOSIS — Y929 Unspecified place or not applicable: Secondary | ICD-10-CM | POA: Diagnosis not present

## 2016-07-12 DIAGNOSIS — Z7982 Long term (current) use of aspirin: Secondary | ICD-10-CM | POA: Diagnosis not present

## 2016-07-12 DIAGNOSIS — N183 Chronic kidney disease, stage 3 (moderate): Secondary | ICD-10-CM | POA: Insufficient documentation

## 2016-07-12 DIAGNOSIS — Y939 Activity, unspecified: Secondary | ICD-10-CM | POA: Diagnosis not present

## 2016-07-12 DIAGNOSIS — IMO0001 Reserved for inherently not codable concepts without codable children: Secondary | ICD-10-CM

## 2016-07-12 DIAGNOSIS — Y999 Unspecified external cause status: Secondary | ICD-10-CM | POA: Insufficient documentation

## 2016-07-12 DIAGNOSIS — S99921A Unspecified injury of right foot, initial encounter: Secondary | ICD-10-CM

## 2016-07-12 DIAGNOSIS — E1122 Type 2 diabetes mellitus with diabetic chronic kidney disease: Secondary | ICD-10-CM | POA: Diagnosis not present

## 2016-07-12 DIAGNOSIS — S61309A Unspecified open wound of unspecified finger with damage to nail, initial encounter: Secondary | ICD-10-CM | POA: Diagnosis not present

## 2016-07-12 MED ORDER — CEPHALEXIN 250 MG PO CAPS
250.0000 mg | ORAL_CAPSULE | Freq: Once | ORAL | Status: AC
  Administered 2016-07-12: 250 mg via ORAL
  Filled 2016-07-12: qty 1

## 2016-07-12 MED ORDER — SERTRALINE HCL 25 MG PO TABS: 25.0000 mg | ORAL_TABLET | Freq: Every day | ORAL | 3 refills | Status: DC

## 2016-07-12 MED ORDER — CEPHALEXIN 500 MG PO CAPS: 500.0000 mg | ORAL_CAPSULE | Freq: Two times a day (BID) | ORAL | 0 refills | Status: AC

## 2016-07-12 NOTE — ED Notes (Signed)
Cleaned right great toe without any diff.  Pt tolerated well   Family with pt

## 2016-07-12 NOTE — ED Provider Notes (Signed)
[facility] Emergency Department Provider Note   ____________________________________________   First MD Initiated Contact with Patient 07/12/16 1935     (approximate)  I have reviewed the triage vital signs and the nursing notes.   HISTORY  Chief Complaint Toe Injury    HPI Mason Kane. is a 83 y.o. male here for evaluation of a right toe injury. About Christmas day, patient stumbled and tore his toenail on the right great toe back. Then yesterday he again stumbled and reinjured the toe, had some bleeding around the "cuticle" area. Of note he is accompanied by his family.  He denies any injury from a fall injuring his toe. He saw his primary care doctor today, they recommended that he may need to come to the ER for evaluation and have consideration for his toenail to be removed. The patient does have neuropathy, and he reports no pain in his toe, is chronically numb as are all of his toes in his feet. No new pain. He was bleeding some yesterday, and he stopped his Brilinta for that reason as whenever she would move it would start oozing again.  No fevers or chills. The right great toe has seemed a little bit swollen and they are concerned he may have broken it.  He is stable to walk on it without difficulty.  Past Medical History:  Diagnosis Date  . Anemia   . Anxiety   . Depression   . Diabetes mellitus without complication ([place])   . Hypercholesteremia   . Hypertension   . Thyroid disease     Patient Active Problem List   Diagnosis Date Noted  . Pulsatile neck mass 12/19/2015  . Type 2 diabetes mellitus with stage 3 chronic kidney disease, without long-term current use of insulin ([place]) 11/29/2015  . Uncontrollable vomiting 10/13/2015  . Eosinophilic esophagitis [id number]  . Dysphagia 09/27/2015  . Acquired hypothyroidism 11/14/2014  . Anemia due to chronic kidney disease 11/14/2014  . 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 Chronic  renal insufficiency, stage III (moderate) 11/14/2014  . CAD in native artery 11/14/2014  . Dyslipidemia 11/14/2014  . Essential (primary) hypertension 11/14/2014  . Acid reflux 11/14/2014  . Cirrhosis ([place]) 11/14/2014  . Cardiomyopathy, ischemic 11/14/2014  . Depression, major, single episode, moderate ([place]) 11/14/2014  . Idiopathic insomnia 11/14/2014  . Arthritis of knee, degenerative 11/14/2014  . Purpura senilis ([place]) 11/14/2014  . Aortic atherosclerosis ([place]) 11/14/2014  . Weight loss 11/14/2014    Past Surgical History:  Procedure Laterality Date  . CARDIAC DEFIBRILLATOR PLACEMENT  2015  . CORONARY ANGIOPLASTY WITH STENT PLACEMENT  12/2010  . CORONARY ANGIOPLASTY WITH STENT PLACEMENT  09/2011   DES placed  . CORONARY ARTERY BYPASS GRAFT  1987   5 vessel    Prior to Admission medications   Medication Sig Start Date End Date Taking? Authorizing Provider  albuterol (PROVENTIL HFA;VENTOLIN HFA) 108 (90 Base) MCG/ACT inhaler Inhale 1-2 puffs into the lungs every 6 (six) hours as needed for wheezing or shortness of breath. 08/04/15   Lorin Picket, PA-C  ALPRAZolam Duanne Moron) 0.25 MG tablet Take 1 tablet (0.25 mg total) by mouth at bedtime. 06/04/16   Glean Hess, MD  aspirin 81 MG chewable tablet Chew 1 tablet by mouth daily.    Historical Provider, MD  carvedilol (COREG) 6.25 MG tablet Take 1 tablet by mouth 2 (two) times daily. 08/11/15   Historical Provider, MD  cephALEXin (KEFLEX) 500 MG capsule Take 1 capsule (  500 mg total) by mouth 2 (two) times daily. 07/12/16 07/22/16  Delman Kitten, MD  finasteride (PROSCAR) 5 MG tablet Take 1 tablet (5 mg total) by mouth daily. 05/08/16   Glean Hess, MD  furosemide (LASIX) 20 MG tablet Take 20 mg by mouth.    Historical Provider, MD  glipiZIDE (GLUCOTROL) 5 MG tablet TAKE 1/2 A TABLET BY MOUTH EVERY MORNING BEFORE BREAKFAST. 04/24/16   Glean Hess, MD  glucose blood test strip 1 each by Other route 2 (two) times daily. 07/25/14    Historical Provider, MD  hydrALAZINE (APRESOLINE) 100 MG tablet Take 1 tablet by mouth 3 (three) times daily.    Historical Provider, MD  levothyroxine (SYNTHROID, LEVOTHROID) 100 MCG tablet TAKE 1 TABLET DAILY 05/13/16   Glean Hess, MD  Multiple Vitamins-Minerals [facility] COMPLETE PO) Take by mouth.    Historical Provider, MD  nitroGLYCERIN (NITRODUR - DOSED IN MG/24 HR) 0.6 mg/hr patch Place 1 patch onto the skin daily. 12/26/15   Historical Provider, MD  nitroGLYCERIN (NITROSTAT) 0.4 MG SL tablet Place 1 tablet under the tongue as needed.    Historical Provider, MD  ondansetron (ZOFRAN-ODT) 8 MG disintegrating tablet Take by mouth. 03/29/16 03/29/17  Historical Provider, MD  Jonetta Speak LANCETS [id number] [place]  07/17/15   Historical Provider, MD  pantoprazole (PROTONIX) 40 MG tablet Take 1 tablet by mouth 2 (two) times daily. 09/15/15   Historical Provider, MD  PEDIATRIC MULTIPLE VIT-C-FA PO Take by mouth.    Historical Provider, MD  predniSONE (DELTASONE) 2.5 MG tablet Take 1 tablet (2.5 mg total) by mouth daily with breakfast. 06/04/16   Glean Hess, MD  sertraline (ZOLOFT) 25 MG tablet Take 1 tablet (25 mg total) by mouth daily. 07/12/16   Glean Hess, MD  simvastatin (ZOCOR) 20 MG tablet TAKE 1 TABLET AT BEDTIME 05/21/16   Glean Hess, MD  tamsulosin (FLOMAX) 0.4 MG CAPS capsule Take 1 capsule (0.4 mg total) by mouth 2 (two) times daily. 06/04/16   Glean Hess, MD  thiamine 100 MG tablet Take 1 tablet by mouth daily. 08/02/14   Historical Provider, MD  ticagrelor (BRILINTA) 90 MG TABS tablet Take 1 tablet by mouth 2 (two) times daily.    Historical Provider, MD    Allergies Remeron [mirtazapine]  Family History  Problem Relation Age of Onset  . Stroke Mother   . CAD Father   . Diabetes Brother     Social History Social History  Substance Use Topics  . Smoking status: Never Smoker  . Smokeless tobacco: Not on file  . Alcohol use No    Review of  Systems Constitutional: No fever/chills Cardiovascular: Denies chest pain. Respiratory: Denies shortness of breath. Gastrointestinal: No abdominal pain.  No nausea, no vomiting.  No diarrhea.  No constipation. Genitourinary: Negative for dysuria. Musculoskeletal: Negative for back pain.See history of present illness Skin: Negative for rash. Neurological: Negative for headaches, focal weakness or numbness.  10-point ROS otherwise negative.  ____________________________________________   PHYSICAL EXAM:  VITAL SIGNS: ED Triage Vitals  Enc Vitals Group     BP 07/12/16 1653 (!) 144/58     Pulse Rate 07/12/16 1653 (!) 58     Resp 07/12/16 1653 18     Temp 07/12/16 1653 97.4 F (36.3 C)     Temp Source 07/12/16 1653 Oral     SpO2 07/12/16 1653 97 %     Weight 07/12/16 1654 126 lb (57.2 kg)  Height 07/12/16 1654 5\' 9"  (1.753 m)     Head Circumference --      Peak Flow --      Pain Score 07/12/16 1654 0     Pain Loc --      Pain Edu? --      Excl. in [place]? --     Constitutional: Alert and oriented. Well appearing and in no acute distress. Eyes: Conjunctivae are normal. PERRL. EOMI. Head: Atraumatic. Nose: No congestion/rhinnorhea. Mouth/Throat: Mucous membranes are moist.  Oropharynx non-erythematous. Neck: No stridor.   Cardiovascular: Normal rate, regular rhythm. Good peripheral circulation.Palpable dorsalis pedis pulses bilateral. Respiratory: Normal respiratory effort.  No retractions. Lungs CTAB.  The right foot demonstrates a traumatic injury which appears to be a partial avulsion involving the cuticle/matrix of the right great toe. It is no longer bleeding. Appears to be superficial injury, likely avulsion type injury to the area. There is some very minimal erythema at the base, with slight swelling. The toe nail appears partially avulsed, but is well adhered and not loose at this time.  The tips of all toes on the right foot have normal capillary refill. There is no  necrosis or ischemic change. He is able to wiggle the toes well. No gross contamination or foreign body is noted.  Musculoskeletal: No lower extremity tenderness nor edema. Neurologic:  Normal speech and language. No gross focal neurologic deficits are appreciated. 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 Skin:  Skin is warm, dry and intact. No rash noted. Psychiatric: Mood and affect are normal. Speech and behavior are normal.  ____________________________________________   LABS (all labs ordered are listed, but only abnormal results are displayed)  Labs Reviewed - No data to display ____________________________________________  EKG   ____________________________________________  RADIOLOGY  Dg Toe Great Right  Result Date: 07/12/2016 CLINICAL DATA:  Stubbed great toe and toenail fell off EXAM: RIGHT GREAT TOE COMPARISON:  None. FINDINGS: Bones appear osteopenic. Transverse fracture through the proximal shaft of the first distal phalanx without definite articular extension. 1/4 shaft plantar displacement of distal fracture fragment. No subluxation. Vascular calcifications in the soft tissues. IMPRESSION: Mildly displaced fracture involving the mid to proximal portion of the first distal phalanx. Vascular calcification Electronically Signed   By: Donavan Foil M.D.   On: 07/12/2016 17:56    ____________________________________________   PROCEDURES  Procedure(s) performed: None  Procedures  Critical Care performed: No  ____________________________________________   INITIAL IMPRESSION / ASSESSMENT AND PLAN / ED COURSE  Pertinent labs & imaging results that were available during my care of the patient were reviewed by me and considered in my medical decision making (see chart for details).  Patient presents after isolated toe injury including a fracture of the phalanx. Really reinjuring. He has an appointment with podiatry on Monday. At the present  time I do not see evidence of superinfection, acute complication, laceration requiring repair, or evidence of need for surgical repair. He is a diabetic, and now has this open injury around the nail bed and I will place him on cephalexin for prevention of superinfection. The foot was cleansed, irrigated, and wrapped and dressed and the patient placed into a hard sole shoe as there is also associated fracture. Discussed carefully with the patient and his family careful return precautions, treatment recommendations including checking the foot twice daily to assure no signs of infection or dusky cold or black toe, and follow-up on Monday with podiatry. Family and patient agreeable with plan.  Clinical  Course      ____________________________________________   FINAL CLINICAL IMPRESSION(S) / ED DIAGNOSES  Final diagnoses:  Injury of toenail of right foot, initial encounter  Unspecified fracture of right toe(s), initial encounter for closed fracture      NEW MEDICATIONS STARTED DURING THIS VISIT:  New Prescriptions   CEPHALEXIN (KEFLEX) 500 MG CAPSULE    Take 1 capsule (500 mg total) by mouth 2 (two) times daily.     Note:  This document was prepared using Dragon voice recognition software and may include unintentional dictation errors.     Delman Kitten, MD 07/12/16 2132

## 2016-07-12 NOTE — ED Triage Notes (Signed)
States on christmas day he stubbed his right big toe and the toenail fell off, states yesterday he fell again and he injured his toe, states he has not been taking his brillenta for 2 days because his toes were bleeding, arrives with right toes wrapped

## 2016-07-12 NOTE — Progress Notes (Signed)
Date:  07/12/2016   Name:  Mason Kane.   DOB:  09-17-1924   MRN:  [id number]   Chief Complaint: Nail Problem (Pt stated injured rt 1st toenail is loose, bleeding) HPI Injury 2 weeks ago.  Bleeding constantly despite wrapping.  He thinks he caught it on the carpet on Christmas day.  Since then it has been bleeding but there is no pain.  Several days ago he stubbed his toe and it bled more.  Still there is no pain.  Review of Systems  Constitutional: Negative for fever.  Respiratory: Negative for chest tightness and shortness of breath.   Cardiovascular: Negative for chest pain and leg swelling.  Gastrointestinal: Negative for abdominal pain.  Skin: Positive for wound.       Fluid bags under eyes  Hematological: Bruises/bleeds easily.    Patient Active Problem List   Diagnosis Date Noted  . Pulsatile neck mass 12/19/2015  . Type 2 diabetes mellitus with stage 3 chronic kidney disease, without long-term current use of insulin ([place]) 11/29/2015  . Uncontrollable vomiting 10/13/2015  . Eosinophilic esophagitis [id number]  . Dysphagia 09/27/2015  . Acquired hypothyroidism 11/14/2014  . Anemia due to chronic kidney disease 11/14/2014  . 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 Chronic renal insufficiency, stage III (moderate) 11/14/2014  . CAD in native artery 11/14/2014  . Dyslipidemia 11/14/2014  . Essential (primary) hypertension 11/14/2014  . Acid reflux 11/14/2014  . Cirrhosis ([place]) 11/14/2014  . Cardiomyopathy, ischemic 11/14/2014  . Depression, major, single episode, moderate ([place]) 11/14/2014  . Idiopathic insomnia 11/14/2014  . Arthritis of knee, degenerative 11/14/2014  . Purpura senilis ([place]) 11/14/2014  . Aortic atherosclerosis ([place]) 11/14/2014  . Weight loss 11/14/2014    Prior to Admission medications   Medication Sig Start Date End Date Taking? Authorizing Provider  albuterol (PROVENTIL HFA;VENTOLIN HFA) 108 (90 Base) MCG/ACT inhaler Inhale 1-2 puffs into the  lungs every 6 (six) hours as needed for wheezing or shortness of breath. 08/04/15  Yes Lorin Picket, PA-C  ALPRAZolam Duanne Moron) 0.25 MG tablet Take 1 tablet (0.25 mg total) by mouth at bedtime. 06/04/16  Yes Glean Hess, MD  aspirin 81 MG chewable tablet Chew 1 tablet by mouth daily.   Yes Historical Provider, MD  carvedilol (COREG) 6.25 MG tablet Take 1 tablet by mouth 2 (two) times daily. 08/11/15  Yes Historical Provider, MD  finasteride (PROSCAR) 5 MG tablet Take 1 tablet (5 mg total) by mouth daily. 05/08/16  Yes Glean Hess, MD  furosemide (LASIX) 20 MG tablet Take 20 mg by mouth.   Yes Historical Provider, MD  glipiZIDE (GLUCOTROL) 5 MG tablet TAKE 1/2 A TABLET BY MOUTH EVERY MORNING BEFORE BREAKFAST. 04/24/16  Yes Glean Hess, MD  glucose blood test strip 1 each by Other route 2 (two) times daily. 07/25/14  Yes Historical Provider, MD  hydrALAZINE (APRESOLINE) 100 MG tablet Take 1 tablet by mouth 3 (three) times daily.   Yes Historical Provider, MD  levothyroxine (SYNTHROID, LEVOTHROID) 100 MCG tablet TAKE 1 TABLET DAILY 05/13/16  Yes Glean Hess, MD  Multiple Vitamins-Minerals [facility] COMPLETE PO) Take by mouth.   Yes Historical Provider, MD  nitroGLYCERIN (NITRODUR - DOSED IN MG/24 HR) 0.6 mg/hr patch Place 1 patch onto the skin daily. 12/26/15  Yes Historical Provider, MD  nitroGLYCERIN (NITROSTAT) 0.4 MG SL tablet Place 1 tablet under the tongue as needed.   Yes Historical Provider, MD  ondansetron (ZOFRAN-ODT) 8 MG disintegrating tablet  Take by mouth. 03/29/16 03/29/17 Yes Historical Provider, MD  Jonetta Speak LANCETS [id number] [place]  07/17/15  Yes Historical Provider, MD  pantoprazole (PROTONIX) 40 MG tablet Take 1 tablet by mouth 2 (two) times daily. 09/15/15  Yes Historical Provider, MD  PEDIATRIC MULTIPLE VIT-C-FA PO Take by mouth.   Yes Historical Provider, MD  predniSONE (DELTASONE) 2.5 MG tablet Take 1 tablet (2.5 mg total) by mouth daily with breakfast. 06/04/16  Yes Glean Hess, MD  sertraline (ZOLOFT) 25 MG tablet Take 1 tablet (25 mg total) by mouth daily. 04/02/16  Yes Glean Hess, MD  simvastatin (ZOCOR) 20 MG tablet TAKE 1 TABLET AT BEDTIME 05/21/16  Yes Glean Hess, MD  tamsulosin (FLOMAX) 0.4 MG CAPS capsule Take 1 capsule (0.4 mg total) by mouth 2 (two) times daily. 06/04/16  Yes Glean Hess, MD  thiamine 100 MG tablet Take 1 tablet by mouth daily. 08/02/14  Yes Historical Provider, MD  ticagrelor (BRILINTA) 90 MG TABS tablet Take 1 tablet by mouth 2 (two) times daily.   Yes Historical Provider, MD    Allergies  Allergen Reactions  . Remeron [Mirtazapine] Other (See Comments)    Excessive sedation    Past Surgical History:  Procedure Laterality Date  . CARDIAC DEFIBRILLATOR PLACEMENT  2015  . CORONARY ANGIOPLASTY WITH STENT PLACEMENT  12/2010  . CORONARY ANGIOPLASTY WITH STENT PLACEMENT  09/2011   DES placed  . CORONARY ARTERY BYPASS GRAFT  1987   5 vessel    Social History  Substance Use Topics  . Smoking status: Never Smoker  . Smokeless tobacco: Not on file  . Alcohol use No     Medication list has been reviewed and updated.   Physical Exam  Constitutional: He is oriented to person, place, and time. He appears well-developed. No distress.  HENT:  Head: Normocephalic and atraumatic.  Cardiovascular: Normal rate, regular rhythm and normal heart sounds.   Pulmonary/Chest: Effort normal and breath sounds normal. No respiratory distress.  Musculoskeletal: Normal range of motion.  Right great toe avulsed at nail bed with dark clot.  Tissue lateral and medially torn.  Odor noted. Toe cleansed with saline - no active bleeding seen.  TAO and telfa dressing applied, wrapped with gauze.  Neurological: He is alert and oriented to person, place, and time.  Skin: Skin is warm and dry. No rash noted.  Skin thinning with small fluid collections under both eyes not interfering with vision  Psychiatric: He has a normal mood and  affect. His behavior is normal. Thought content normal.  Nursing note and vitals reviewed.   BP (!) 142/72   Pulse (!) 58   Temp 97.6 F (36.4 C)   Ht 5\' 9"  (1.753 m)   Wt 126 lb (57.2 kg)   SpO2 98%   BMI 18.61 kg/m   Assessment and Plan: 1. Avulsion of nail plate, initial encounter Persistent bleeding and evidence of infection Sent to ED with family friend [facility] appt for Monday made - he should keep it for follow up   Halina Maidens, MD [facility]  07/12/2016

## 2016-07-12 NOTE — Discharge Instructions (Signed)
Please follow up closely with Dr. Cleda Mccreedy Monday as scheduled.  Return to the emergency room if you notice a fever, a blue, cold, or very painful right foot or toe.  Make sure to check your toe twice a day to ensure it is not becoming red, swollen, infected, bleeding, or other concerns arise.  Please utilize the hard sole shoe you were provided when you are bearing any weight or attempting to walk on the foot.

## 2016-07-12 NOTE — ED Notes (Signed)
Toe dressed with non stick dressing, guaze and buddy tape and hard sole shoe applied to right foot.

## 2016-08-06 ENCOUNTER — Other Ambulatory Visit: Payer: Self-pay | Admitting: Internal Medicine

## 2016-08-06 DIAGNOSIS — K200 Eosinophilic esophagitis: Secondary | ICD-10-CM

## 2016-08-09 ENCOUNTER — Encounter: Payer: Self-pay | Admitting: Internal Medicine

## 2016-08-09 ENCOUNTER — Ambulatory Visit (INDEPENDENT_AMBULATORY_CARE_PROVIDER_SITE_OTHER): Payer: Medicare Other | Admitting: Internal Medicine

## 2016-08-09 VITALS — BP 116/78 | HR 48 | Temp 97.6°F | Ht 69.0 in | Wt 124.0 lb

## 2016-08-09 DIAGNOSIS — K200 Eosinophilic esophagitis: Secondary | ICD-10-CM

## 2016-08-09 DIAGNOSIS — N183 Chronic kidney disease, stage 3 unspecified: Secondary | ICD-10-CM

## 2016-08-09 DIAGNOSIS — E039 Hypothyroidism, unspecified: Secondary | ICD-10-CM

## 2016-08-09 DIAGNOSIS — N2889 Other specified disorders of kidney and ureter: Secondary | ICD-10-CM

## 2016-08-09 DIAGNOSIS — D649 Anemia, unspecified: Secondary | ICD-10-CM | POA: Diagnosis not present

## 2016-08-09 NOTE — Progress Notes (Signed)
Date:  08/09/2016   Name:  Mason Kane.   DOB:  [birth year]   MRN:  [id number]   Chief Complaint: Thyroid Problem Patient states that he has been taking thyroid medication even though TSH was very high.  It is also not clear if he has been taking predisone 5 mg alternating with 2.5 mg or just 5 mg every day.  He has emesis 3-4 days a week - usually only once in the AM but sometimes also after supper. He is worried about the edema under his eyes.  His last HGB was very low as was his serum protein.  [facility] requested some labs to be done today. His depression is much better since starting Zoloft and there has been no increase in emesis and no diarrhea.   Thyroid Problem  Presents for follow-up visit. Patient reports no constipation, diarrhea, fatigue or palpitations. The symptoms have been worsening (last TSH very high).  Anemia  Presents for follow-up visit. There has been no abdominal pain, fever or palpitations. (Started on Aranesp)   Lab Results  Component Value Date   TSH 55.110 (H) 06/04/2016   Weight loss - now on prednisone 2.5 mg per day. Wt Readings from Last 3 Encounters:  08/09/16 124 lb (56.2 kg)  07/12/16 126 lb (57.2 kg)  07/12/16 126 lb (57.2 kg)   Lab Results  Component Value Date   HGBA1C 5.5 06/04/2016    Review of Systems  Constitutional: Negative for chills, fatigue, fever and unexpected weight change.  Eyes: Positive for discharge.  Respiratory: Negative for choking and shortness of breath.   Cardiovascular: Negative for chest pain, palpitations and leg swelling.  Gastrointestinal: Positive for vomiting. Negative for abdominal pain, constipation and diarrhea.  Musculoskeletal: Negative for arthralgias.  Psychiatric/Behavioral: Negative for dysphoric mood and sleep disturbance.    Patient Active Problem List   Diagnosis Date Noted  . Pulsatile neck mass 12/19/2015  . Type 2 diabetes mellitus with stage 3 chronic kidney disease, without  long-term current use of insulin ([place]) 11/29/2015  . Uncontrollable vomiting 10/13/2015  . Eosinophilic esophagitis [id number]  . Dysphagia 09/27/2015  . Acquired hypothyroidism 11/14/2014  . Anemia due to chronic kidney disease 11/14/2014  . 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 Chronic renal insufficiency, stage III (moderate) 11/14/2014  . CAD in native artery 11/14/2014  . Dyslipidemia 11/14/2014  . Essential (primary) hypertension 11/14/2014  . Acid reflux 11/14/2014  . Cirrhosis ([place]) 11/14/2014  . Cardiomyopathy, ischemic 11/14/2014  . Depression, major, single episode, moderate ([place]) 11/14/2014  . Idiopathic insomnia 11/14/2014  . Arthritis of knee, degenerative 11/14/2014  . Purpura senilis ([place]) 11/14/2014  . Aortic atherosclerosis ([place]) 11/14/2014  . Weight loss 11/14/2014    Prior to Admission medications   Medication Sig Start Date End Date Taking? Authorizing Provider  albuterol (PROVENTIL HFA;VENTOLIN HFA) 108 (90 Base) MCG/ACT inhaler Inhale 1-2 puffs into the lungs every 6 (six) hours as needed for wheezing or shortness of breath. 08/04/15  Yes Lorin Picket, PA-C  ALPRAZolam Duanne Moron) 0.25 MG tablet Take 1 tablet (0.25 mg total) by mouth at bedtime. 06/04/16  Yes Glean Hess, MD  aspirin 81 MG chewable tablet Chew 1 tablet by mouth daily.   Yes Historical Provider, MD  carvedilol (COREG) 6.25 MG tablet Take 1 tablet by mouth 2 (two) times daily. 08/11/15  Yes Historical Provider, MD  finasteride (PROSCAR) 5 MG tablet TAKE 1 TABLET (5 MG TOTAL) BY MOUTH DAILY. 08/06/16  Yes  Glean Hess, MD  furosemide (LASIX) 20 MG tablet Take 20 mg by mouth.   Yes Historical Provider, MD  glipiZIDE (GLUCOTROL) 5 MG tablet TAKE 1/2 A TABLET BY MOUTH EVERY MORNING BEFORE BREAKFAST. 04/24/16  Yes Glean Hess, MD  glucose blood test strip 1 each by Other route 2 (two) times daily. 07/25/14  Yes Historical Provider, MD  hydrALAZINE (APRESOLINE) 100 MG tablet Take 1 tablet  by mouth 3 (three) times daily.   Yes Historical Provider, MD  levothyroxine (SYNTHROID, LEVOTHROID) 100 MCG tablet TAKE 1 TABLET DAILY 05/13/16  Yes Glean Hess, MD  Multiple Vitamins-Minerals [facility] COMPLETE PO) Take by mouth.   Yes Historical Provider, MD  nitroGLYCERIN (NITRODUR - DOSED IN MG/24 HR) 0.6 mg/hr patch Place 1 patch onto the skin daily. 12/26/15  Yes Historical Provider, MD  nitroGLYCERIN (NITROSTAT) 0.4 MG SL tablet Place 1 tablet under the tongue as needed.   Yes Historical Provider, MD  ondansetron (ZOFRAN-ODT) 8 MG disintegrating tablet Take by mouth. 03/29/16 03/29/17 Yes Historical Provider, MD  Jonetta Speak LANCETS [id number] [place]  07/17/15  Yes Historical Provider, MD  pantoprazole (PROTONIX) 40 MG tablet Take 1 tablet by mouth 2 (two) times daily. 09/15/15  Yes Historical Provider, MD  PEDIATRIC MULTIPLE VIT-C-FA PO Take by mouth.   Yes Historical Provider, MD  predniSONE (DELTASONE) 2.5 MG tablet Take 1 tablet (2.5 mg total) by mouth daily with breakfast. 06/04/16  Yes Glean Hess, MD  predniSONE (DELTASONE) 5 MG tablet TAKE 1 TABLET (5 MG TOTAL) BY MOUTH DAILY WITH BREAKFAST. 08/06/16  Yes Glean Hess, MD  sertraline (ZOLOFT) 25 MG tablet Take 1 tablet (25 mg total) by mouth daily. 07/12/16  Yes Glean Hess, MD  simvastatin (ZOCOR) 20 MG tablet TAKE 1 TABLET AT BEDTIME 05/21/16  Yes Glean Hess, MD  tamsulosin (FLOMAX) 0.4 MG CAPS capsule Take 1 capsule (0.4 mg total) by mouth 2 (two) times daily. 06/04/16  Yes Glean Hess, MD  thiamine 100 MG tablet Take 1 tablet by mouth daily. 08/02/14  Yes Historical Provider, MD  ticagrelor (BRILINTA) 90 MG TABS tablet Take 1 tablet by mouth 2 (two) times daily.   Yes Historical Provider, MD    Allergies  Allergen Reactions  . Remeron [Mirtazapine] Other (See Comments)    Excessive sedation    Past Surgical History:  Procedure Laterality Date  . CARDIAC DEFIBRILLATOR PLACEMENT  2015  . CORONARY ANGIOPLASTY  WITH STENT PLACEMENT  12/2010  . CORONARY ANGIOPLASTY WITH STENT PLACEMENT  09/2011   DES placed  . CORONARY ARTERY BYPASS GRAFT  1987   5 vessel    Social History  Substance Use Topics  . Smoking status: Never Smoker  . Smokeless tobacco: Never Used  . Alcohol use No     Medication list has been reviewed and updated.   Physical Exam  Constitutional: He is oriented to person, place, and time. He appears well-developed. No distress.  HENT:  Head: Normocephalic and atraumatic.  Cardiovascular: Normal rate, regular rhythm and normal heart sounds.   Pulmonary/Chest: Effort normal and breath sounds normal. No respiratory distress.  Musculoskeletal: Normal range of motion.  Neurological: He is alert and oriented to person, place, and time.  Skin: Skin is warm and dry. No rash noted.  Skin thinning with small fluid collections under both eyes not interfering with vision  Psychiatric: He has a normal mood and affect. His behavior is normal. Thought content normal.  Nursing note and vitals  reviewed.   BP 116/78   Pulse (!) 48   Temp 97.6 F (36.4 C)   Ht 5\' 9"  (1.753 m)   Wt 124 lb (56.2 kg)   SpO2 98%   BMI 18.31 kg/m   Assessment and Plan: 1. Acquired hypothyroidism Will adjust dose if needed - reminded patient to take on an empty stomach with no food or medication - Thyroid Panel With TSH  2. Chronic renal insufficiency, stage III (moderate) - Basic metabolic panel - VITAMIN D 25 Hydroxy (Vit-D Deficiency, Fractures)  3. Eosinophilic esophagitis Take Prednisone 5 mg daily as he seems to do better at this dose  4. Anemia, unspecified type Currently getting Aranesp and followed by Nephrology - Ferritin - Iron - Iron Binding Cap (TIBC) - CBC with Differential/Platelet   Halina Maidens, MD [facility]  08/09/2016

## 2016-08-09 NOTE — Patient Instructions (Signed)
Prednisone 5 mg daily 

## 2016-08-10 ENCOUNTER — Other Ambulatory Visit: Payer: Self-pay | Admitting: Internal Medicine

## 2016-08-10 LAB — BASIC METABOLIC PANEL
BUN / CREAT RATIO: 23 (ref 10–24)
BUN: 41 mg/dL — AB (ref 8–27)
CHLORIDE: 105 mmol/L (ref 96–106)
CO2: 18 mmol/L (ref 18–29)
Calcium: 8.6 mg/dL (ref 8.6–10.2)
Creatinine, Ser: 1.78 mg/dL — ABNORMAL HIGH (ref 0.76–1.27)
GFR calc Af Amer: 38 mL/min/{1.73_m2} — ABNORMAL LOW (ref >59)
GFR calc non Af Amer: 33 mL/min/{1.73_m2} — ABNORMAL LOW (ref >59)
GLUCOSE: 92 mg/dL (ref 65–99)
Potassium: 4.6 mmol/L (ref 3.5–5.2)
SODIUM: 139 mmol/L (ref 134–144)

## 2016-08-10 LAB — THYROID PANEL WITH TSH
Free Thyroxine Index: 1.3 (ref 1.2–4.9)
T3 Uptake Ratio: 28 % (ref 24–39)
T4, Total: 4.8 ug/dL (ref 4.5–12.0)
TSH: 53.42 u[IU]/mL — AB (ref 0.450–4.500)

## 2016-08-10 LAB — CBC WITH DIFFERENTIAL/PLATELET
Basophils Absolute: 0 10*3/uL (ref 0.0–0.2)
Basos: 0 %
EOS (ABSOLUTE): 0.2 10*3/uL (ref 0.0–0.4)
Eos: 4 %
Hematocrit: 25.1 % — ABNORMAL LOW (ref 37.5–51.0)
Hemoglobin: 7.9 g/dL — ABNORMAL LOW (ref 13.0–17.7)
IMMATURE GRANULOCYTES: 1 %
Immature Grans (Abs): 0 10*3/uL (ref 0.0–0.1)
Lymphocytes Absolute: 0.3 10*3/uL — ABNORMAL LOW (ref 0.7–3.1)
Lymphs: 7 %
MCH: 32.2 pg (ref 26.6–33.0)
MCHC: 31.5 g/dL (ref 31.5–35.7)
MCV: 102 fL — ABNORMAL HIGH (ref 79–97)
MONOS ABS: 0.4 10*3/uL (ref 0.1–0.9)
Monocytes: 9 %
NEUTROS PCT: 79 %
Neutrophils Absolute: 3.4 10*3/uL (ref 1.4–7.0)
PLATELETS: 118 10*3/uL — AB (ref 150–379)
RBC: 2.45 x10E6/uL — CL (ref 4.14–5.80)
RDW: 15.6 % — AB (ref 12.3–15.4)
WBC: 4.2 10*3/uL (ref 3.4–10.8)

## 2016-08-10 LAB — IRON AND TIBC
IRON SATURATION: 18 % (ref 15–55)
IRON: 41 ug/dL (ref 38–169)
Total Iron Binding Capacity: 234 ug/dL — ABNORMAL LOW (ref 250–450)
UIBC: 193 ug/dL (ref 111–343)

## 2016-08-10 LAB — VITAMIN D 25 HYDROXY (VIT D DEFICIENCY, FRACTURES): Vit D, 25-Hydroxy: 34.8 ng/mL (ref 30.0–100.0)

## 2016-08-10 LAB — FERRITIN: Ferritin: 166 ng/mL (ref 30–400)

## 2016-08-10 MED ORDER — LEVOTHYROXINE SODIUM 125 MCG PO TABS: 125.0000 ug | ORAL_TABLET | Freq: Every day | ORAL | 1 refills | Status: DC

## 2016-08-13 ENCOUNTER — Other Ambulatory Visit: Payer: Self-pay | Admitting: Internal Medicine

## 2016-08-13 DIAGNOSIS — E039 Hypothyroidism, unspecified: Secondary | ICD-10-CM

## 2016-08-17 LAB — HM HEPATITIS C SCREENING LAB: HM Hepatitis Screen: NEGATIVE

## 2016-08-18 DIAGNOSIS — S92401A Displaced unspecified fracture of right great toe, initial encounter for closed fracture: Secondary | ICD-10-CM | POA: Insufficient documentation

## 2016-08-18 HISTORY — DX: Displaced unspecified fracture of right great toe, initial encounter for closed fracture: S92.401A

## 2016-09-20 ENCOUNTER — Ambulatory Visit (INDEPENDENT_AMBULATORY_CARE_PROVIDER_SITE_OTHER): Payer: Medicare Other | Admitting: Internal Medicine

## 2016-09-20 ENCOUNTER — Encounter: Payer: Self-pay | Admitting: Internal Medicine

## 2016-09-20 VITALS — BP 134/62 | HR 48 | Resp 16 | Ht 69.0 in | Wt 125.4 lb

## 2016-09-20 DIAGNOSIS — I2510 Atherosclerotic heart disease of native coronary artery without angina pectoris: Secondary | ICD-10-CM

## 2016-09-20 DIAGNOSIS — D692 Other nonthrombocytopenic purpura: Secondary | ICD-10-CM | POA: Diagnosis not present

## 2016-09-20 DIAGNOSIS — E039 Hypothyroidism, unspecified: Secondary | ICD-10-CM

## 2016-09-20 DIAGNOSIS — I10 Essential (primary) hypertension: Secondary | ICD-10-CM

## 2016-09-20 DIAGNOSIS — K200 Eosinophilic esophagitis: Secondary | ICD-10-CM

## 2016-09-20 DIAGNOSIS — E1122 Type 2 diabetes mellitus with diabetic chronic kidney disease: Secondary | ICD-10-CM

## 2016-09-20 DIAGNOSIS — N183 Chronic kidney disease, stage 3 unspecified: Secondary | ICD-10-CM

## 2016-09-20 MED ORDER — ROSUVASTATIN CALCIUM 20 MG PO TABS: 20.0000 mg | ORAL_TABLET | Freq: Every day | ORAL | 3 refills | Status: DC

## 2016-09-20 NOTE — Progress Notes (Signed)
Date:  09/20/2016   Name:  Mason Kane.   DOB:  March 11, 1925   MRN:  [id number]  Mr. Mason Kane is accompanied by his son and family friend Mason Kane.  Chief Complaint: Foot Injury (Broke several toes on Right Foot. ) and Diabetes [facility] changed from Glipizide to Insulin. Wants to go back on Glip. BS 104 Today ) Foot Injury   Incident onset: fractured toe and developed an ulcer. The incident occurred at home. The injury mechanism was a direct blow. The pain is present in the right toes (fracture and mild infection). Pertinent negatives include no numbness.  Diabetes  He presents for his follow-up diabetic visit. He has type 2 diabetes mellitus. Pertinent negatives for hypoglycemia include no headaches, nervousness/anxiousness or tremors. Pertinent negatives for diabetes include no chest pain, no fatigue, no polydipsia and no polyuria.  Emesis   This is a chronic problem. The current episode started more than 1 year ago. The problem occurs intermittently. The problem has been gradually improving. The emesis has an appearance of stomach contents. There has been no fever. Associated symptoms include arthralgias. Pertinent negatives include no abdominal pain, chest pain, coughing or headaches. Treatments tried: on prednisone 5 mg.  Thyroid Problem  Presents for follow-up visit. Patient reports no anxiety, constipation, fatigue, palpitations or tremors. (TSH remains high - dose changed 5 weeks ago) The symptoms have been stable.   Mr Mason Kane was admitted to [facility] in February with CHF and angina, bacteremia secondary to gallstones, hypothyroidism, DM, anemia and right great toe fx.  He was treated for infection, gen surgery declined to perform cholecystectomy and medications were adjusted - crestor started in place of zocor, hydralazine and lasix were added due to decline in renal function and he was placed on SSI instead of glipizide. He spent a week in rehab and then returned home 10 days ago.  Since being home  he stopped the insulin and resumed glipizide.  He feels like he is doing well.  His mental status is A, Ox4.  His appetite is fairly good.  He denies chest pains or edema.  He has been slightly SOB and family recommended that he resume NTG patch.  He has only used it a few times. He also complained about bruising so cardiology has planned for him to finish Brillinta and then start Plavix. He is much less stressed out.  He has his wife settled in a LTC facility now and he is less worried.  He is sleeping well and only using Xanax intermittently at HS.  This was supposed to be discontinued but he still has an old Rx.  Lab Results  Component Value Date   HGBA1C 5.5 06/04/2016     Review of Systems  Constitutional: Negative for appetite change, fatigue and unexpected weight change.  HENT: Negative for congestion, sore throat and trouble swallowing.   Eyes: Negative for visual disturbance.  Respiratory: Positive for shortness of breath. Negative for cough, chest tightness and wheezing.   Cardiovascular: Negative for chest pain, palpitations and leg swelling.  Gastrointestinal: Positive for vomiting. Negative for abdominal pain, blood in stool and constipation.  Endocrine: Negative for polydipsia and polyuria.  Genitourinary: Negative for dysuria and hematuria.  Musculoskeletal: Positive for arthralgias.  Skin: Negative for color change and rash.  Neurological: Negative for tremors, numbness and headaches.  Psychiatric/Behavioral: Negative for dysphoric mood and sleep disturbance. The patient is not nervous/anxious.     Patient Active Problem List   Diagnosis Date Noted  .  Pulsatile neck mass 12/19/2015  . Type 2 diabetes mellitus with stage 3 chronic kidney disease, without long-term current use of insulin ([place]) 11/29/2015  . Uncontrollable vomiting 10/13/2015  . Eosinophilic esophagitis 97/35/3299  . Dysphagia 09/27/2015  . Acquired hypothyroidism 11/14/2014  . Anemia due to chronic  kidney disease 11/14/2014  . 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 Chronic renal insufficiency, stage III (moderate) 11/14/2014  . CAD in native artery 11/14/2014  . Dyslipidemia 11/14/2014  . Essential (primary) hypertension 11/14/2014  . Acid reflux 11/14/2014  . Cirrhosis ([place]) 11/14/2014  . Cardiomyopathy, ischemic 11/14/2014  . Depression, major, single episode, moderate ([place]) 11/14/2014  . Idiopathic insomnia 11/14/2014  . Arthritis of knee, degenerative 11/14/2014  . Purpura senilis ([place]) 11/14/2014  . Aortic atherosclerosis ([place]) 11/14/2014  . Weight loss 11/14/2014    Prior to Admission medications   Medication Sig Start Date End Date Taking? Authorizing Provider  albuterol (PROVENTIL HFA;VENTOLIN HFA) 108 (90 Base) MCG/ACT inhaler Inhale 1-2 puffs into the lungs every 6 (six) hours as needed for wheezing or shortness of breath. 08/04/15  Yes Lorin Picket, PA-C  ALPRAZolam Duanne Moron) 0.25 MG tablet Take 1 tablet (0.25 mg total) by mouth at bedtime. 06/04/16  Yes Glean Hess, MD  aspirin EC 81 MG tablet Take by mouth.   Yes Historical Provider, MD  carvedilol (COREG) 6.25 MG tablet Take 1 tablet by mouth 2 (two) times daily. 08/11/15  Yes Historical Provider, MD  finasteride (PROSCAR) 5 MG tablet TAKE 1 TABLET (5 MG TOTAL) BY MOUTH DAILY. 08/06/16  Yes Glean Hess, MD  furosemide (LASIX) 20 MG tablet Take by mouth. 08/23/16  Yes Historical Provider, MD  glucose blood test strip 1 each by Other route 2 (two) times daily. 07/25/14  Yes Historical Provider, MD  hydrALAZINE (APRESOLINE) 100 MG tablet Take 1 tablet by mouth 3 (three) times daily.   Yes Historical Provider, MD  insulin lispro (HUMALOG) 100 UNIT/ML injection Inject into the skin. 08/23/16 08/23/17 Yes Historical Provider, MD  levothyroxine (SYNTHROID, LEVOTHROID) 125 MCG tablet Take by mouth.   Yes Historical Provider, MD  Multiple Vitamins-Minerals (MULTI COMPLETE PO) Take by mouth.   Yes Historical Provider, MD   nitroGLYCERIN (NITRODUR - DOSED IN MG/24 HR) 0.6 mg/hr patch Place 1 patch onto the skin daily. 12/26/15  Yes Historical Provider, MD  nitroGLYCERIN (NITROSTAT) 0.4 MG SL tablet Place 1 tablet under the tongue as needed.   Yes Historical Provider, MD  ondansetron (ZOFRAN-ODT) 8 MG disintegrating tablet Take by mouth. 03/29/16 03/29/17 Yes Historical Provider, MD  Jonetta Speak LANCETS 24Q [place]  07/17/15  Yes Historical Provider, MD  pantoprazole (PROTONIX) 40 MG tablet TAKE 1 TABLET TWICE A DAY 30 TO 60 MINUTES PRIOR TO BREAKFAST AND DINNER 09/10/16  Yes Historical Provider, MD  PEDIATRIC MULTIPLE VIT-C-FA PO Take by mouth.   Yes Historical Provider, MD  predniSONE (DELTASONE) 5 MG tablet Take by mouth.   Yes Historical Provider, MD  rosuvastatin (CRESTOR) 20 MG tablet Take 20 mg by mouth daily.   Yes Historical Provider, MD  senna-docusate (SENOKOT-S) 8.6-50 MG tablet Take by mouth. 08/23/16 08/23/17 Yes Historical Provider, MD  sertraline (ZOLOFT) 25 MG tablet Take 1 tablet (25 mg total) by mouth daily. 07/12/16  Yes Glean Hess, MD      Yes Glean Hess, MD  tamsulosin (FLOMAX) 0.4 MG CAPS capsule Take 1 capsule (0.4 mg total) by mouth 2 (two) times daily. 06/04/16  Yes Glean Hess, MD  thiamine 100 MG tablet Take 1 tablet by mouth daily. 08/02/14  Yes Historical Provider, MD  ticagrelor (BRILINTA) 90 MG TABS tablet Take 1 tablet by mouth 2 (two) times daily.   Yes Historical Provider, MD  glipiZIDE (GLUCOTROL) 5 MG tablet TAKE 1/2 A TABLET BY MOUTH EVERY MORNING BEFORE BREAKFAST. Patient not taking: Reported on 09/20/2016 04/24/16   Glean Hess, MD    Allergies  Allergen Reactions  . Remeron [Mirtazapine] Other (See Comments)    Excessive sedation    Past Surgical History:  Procedure Laterality Date  . CARDIAC DEFIBRILLATOR PLACEMENT  2015  . CORONARY ANGIOPLASTY WITH STENT PLACEMENT  12/2010  . CORONARY ANGIOPLASTY WITH STENT PLACEMENT  09/2011   DES placed  . CORONARY  ARTERY BYPASS GRAFT  1987   5 vessel    Social History  Substance Use Topics  . Smoking status: Never Smoker  . Smokeless tobacco: Never Used  . Alcohol use No     Medication list has been reviewed and updated.   Physical Exam  Constitutional: He is oriented to person, place, and time. He appears well-developed. No distress.  HENT:  Head: Normocephalic and atraumatic.  Eyes: EOM are normal. Pupils are equal, round, and reactive to light.  Neck: Normal range of motion. Neck supple. No spinous process tenderness present.  Cardiovascular: Normal rate and regular rhythm.   No extrasystoles are present. Exam reveals decreased pulses.   Murmur heard.  Systolic murmur is present with a grade of 3/6  Pulses:      Posterior tibial pulses are 1+ on the right side, and 1+ on the left side.  Pulmonary/Chest: Effort normal and breath sounds normal. No respiratory distress.  Abdominal: Soft. Normal appearance and bowel sounds are normal. There is no tenderness.  Musculoskeletal: Normal range of motion.  Neurological: He is alert and oriented to person, place, and time.  Skin: Skin is warm and dry. No rash noted.  Right foot in surgical shoe  Psychiatric: He has a normal mood and affect. His speech is normal and behavior is normal. Thought content normal.  Nursing note and vitals reviewed.  Wt Readings from Last 3 Encounters:  09/20/16 125 lb 6.4 oz (56.9 kg)  08/09/16 124 lb (56.2 kg)  07/12/16 126 lb (57.2 kg)    BP 134/62   Pulse (!) 48   Resp 16   Ht 5\' 9"  (1.753 m)   Wt 125 lb 6.4 oz (56.9 kg)   BMI 18.52 kg/m   Assessment and Plan: 1. Essential (primary) hypertension Controlled; now on Hydral and Lasix  2. Eosinophilic esophagitis Continue Prednisone 5 mg daily  3. Type 2 diabetes mellitus with stage 3 chronic kidney disease, without long-term current use of insulin (HCC) Continue glipizide; remain off insulin Check A1C at next visit  4. Acquired hypothyroidism On  supplement with dose increased and TSH remains very high - Endo consult pending  5. Chronic renal insufficiency, stage III (moderate) Slightly worse on labs done yesterday - likely from initiation of daily Lasix.  May need to reduce dose  6. CAD in native artery Improved with change in therapy Stop zocor; continue Crestor  7. Purpura senilis ([place]) Finish Brillinta the begin Plavix   Meds ordered this encounter  Medications  . rosuvastatin (CRESTOR) 20 MG tablet    Sig: Take 1 tablet (20 mg total) by mouth daily.    Dispense:  90 tablet    Refill:  3   I spent 55 minutes with patient  on this encounter.  More than 50% of time was spent in face to face education, medication management, counseling and care coordination.  Halina Maidens, MD [facility]  09/20/2016

## 2016-10-14 ENCOUNTER — Other Ambulatory Visit: Payer: Self-pay | Admitting: Internal Medicine

## 2016-10-23 ENCOUNTER — Other Ambulatory Visit: Payer: Self-pay | Admitting: Internal Medicine

## 2016-10-29 ENCOUNTER — Other Ambulatory Visit: Payer: Self-pay | Admitting: Internal Medicine

## 2016-10-31 ENCOUNTER — Telehealth: Payer: Self-pay

## 2016-10-31 NOTE — Telephone Encounter (Signed)
Angie called and said that since starting Rosuvastatin, pt has had excessive sleepiness and swelling around the eyes. Does he need to go back to the old regimen or is this something else going on?

## 2016-11-01 NOTE — Telephone Encounter (Signed)
I doubt that the crestor is causing those symptoms.  He had swelling around his eyes before which may have been the heart failure.  I suggest calling his cardiologist and getting a follow up appointment.

## 2016-11-01 NOTE — Telephone Encounter (Signed)
Informed pt to call cardiologist for follow up.

## 2016-11-19 ENCOUNTER — Other Ambulatory Visit: Payer: Self-pay | Admitting: Internal Medicine

## 2016-11-19 DIAGNOSIS — F321 Major depressive disorder, single episode, moderate: Secondary | ICD-10-CM

## 2016-11-22 ENCOUNTER — Encounter: Payer: Self-pay | Admitting: Emergency Medicine

## 2016-11-22 ENCOUNTER — Ambulatory Visit
Admission: EM | Admit: 2016-11-22 | Discharge: 2016-11-22 | Disposition: A | Discharge status: Home / Self Care | Payer: Medicare Other | Attending: Emergency Medicine | Admitting: Emergency Medicine

## 2016-11-22 DIAGNOSIS — B0230 Zoster ocular disease, unspecified: Secondary | ICD-10-CM

## 2016-11-22 MED ORDER — FAMCICLOVIR 500 MG PO TABS: 500.0000 mg | ORAL_TABLET | Freq: Two times a day (BID) | ORAL | 0 refills | Status: DC

## 2016-11-22 NOTE — ED Provider Notes (Signed)
CSN: [id number]     Arrival date & time 11/22/16  2008 History   First MD Initiated Contact with Patient 11/22/16 2141     Chief Complaint  Patient presents with  . Rash   (Consider location/radiation/quality/duration/timing/severity/associated sxs/prior Treatment) HPI  This 83 year old male who presents with painful spots on the left side of his forehead that started today. The patient is under a lot of stress his wife of 58 years of age has recently died and he was at the funeral home when they first noticed spots today. The present time it does not affect his eye although he does have a vesicle in the eye brow lateral to the eye. Is on have any obvious tearing no conjunctivitis. He said the pain "is not bad".        Past Medical History:  Diagnosis Date  . Anemia   . Anxiety   . Depression   . Diabetes mellitus without complication ([place])   . Hypercholesteremia   . Hypertension   . Thyroid disease    Past Surgical History:  Procedure Laterality Date  . CARDIAC DEFIBRILLATOR PLACEMENT  2015  . CORONARY ANGIOPLASTY WITH STENT PLACEMENT  12/2010  . CORONARY ANGIOPLASTY WITH STENT PLACEMENT  09/2011   DES placed  . CORONARY ARTERY BYPASS GRAFT  1987   5 vessel   Family History  Problem Relation Age of Onset  . Stroke Mother   . CAD Father   . Diabetes Brother    Social History  Substance Use Topics  . Smoking status: Never Smoker  . Smokeless tobacco: Never Used  . Alcohol use No    Review of Systems  Constitutional: Negative for chills, fatigue and fever.  Eyes: Negative for photophobia, pain, discharge and redness.  Skin: Positive for rash.  All other systems reviewed and are negative.   Allergies  Remeron [mirtazapine]  Home Medications   Prior to Admission medications   Medication Sig Start Date End Date Taking? Authorizing Provider  albuterol (PROVENTIL HFA;VENTOLIN HFA) 108 (90 Base) MCG/ACT inhaler Inhale 1-2 puffs into the lungs every 6 (six) hours  as needed for wheezing or shortness of breath. 08/04/15   Lorin Picket, PA-C  ALPRAZolam Duanne Moron) 0.25 MG tablet Take 1 tablet (0.25 mg total) by mouth at bedtime. Patient not taking: Reported on 09/20/2016 06/04/16   Glean Hess, MD  aspirin EC 81 MG tablet Take by mouth.    [provider]  carvedilol (COREG) 6.25 MG tablet Take 1 tablet by mouth 2 (two) times daily. 08/11/15   [provider]  famciclovir (FAMVIR) 500 MG tablet Take 1 tablet (500 mg total) by mouth 2 (two) times daily. 11/22/16   Lorin Picket, PA-C  finasteride (PROSCAR) 5 MG tablet TAKE 1 TABLET (5 MG TOTAL) BY MOUTH DAILY. 10/29/16   Glean Hess, MD  furosemide (LASIX) 20 MG tablet Take by mouth. 08/23/16   [provider]  glipiZIDE (GLUCOTROL) 5 MG tablet TAKE 1/2 A TABLET BY MOUTH EVERY MORNING BEFORE BREAKFAST. 10/23/16   Glean Hess, MD  glucose blood test strip 1 each by Other route 2 (two) times daily. 07/25/14   [provider]  hydrALAZINE (APRESOLINE) 100 MG tablet Take 1 tablet by mouth 3 (three) times daily.    [provider]  levothyroxine (SYNTHROID, LEVOTHROID) 125 MCG tablet Take by mouth.    [provider]  Multiple Vitamins-Minerals (MULTI COMPLETE PO) Take by mouth.    [provider]  nitroGLYCERIN (  NITROSTAT) 0.4 MG SL tablet Place 1 tablet under the tongue as needed.    [provider]  ondansetron (ZOFRAN-ODT) 8 MG disintegrating tablet Take by mouth. 03/29/16 03/29/17  [provider]  Jonetta Speak LANCETS [address] [place]  07/17/15   [provider]  pantoprazole (PROTONIX) 40 MG tablet TAKE 1 TABLET TWICE A DAY 30 TO 60 MINUTES PRIOR TO BREAKFAST AND DINNER 09/10/16   [provider]  PEDIATRIC MULTIPLE VIT-C-FA PO Take by mouth.    [provider]  predniSONE (DELTASONE) 5 MG tablet Take by mouth.    [provider]  rosuvastatin (CRESTOR) 20 MG tablet TAKE 1 TABLET BY MOUTH  AT BEDTIME FOR HIGH CHOLESTEROL 10/14/16   Glean Hess, MD  senna-docusate (SENOKOT-S) 8.6-50 MG tablet Take by mouth. 08/23/16 08/23/17  [provider]  sertraline (ZOLOFT) 25 MG tablet TAKE 1 TABLET (25 MG TOTAL) BY MOUTH DAILY. 11/19/16   Glean Hess, MD  tamsulosin (FLOMAX) 0.4 MG CAPS capsule Take 1 capsule (0.4 mg total) by mouth 2 (two) times daily. 06/04/16   Glean Hess, MD  thiamine 100 MG tablet Take 1 tablet by mouth daily. 08/02/14   [provider]   Meds Ordered and Administered this Visit  Medications - No data to display  BP (!) 163/69 (BP Location: Right Arm)   Pulse 60   Temp 97.8 F (36.6 C) (Oral)   Resp 16   Ht 5\' 9"  (1.753 m)   Wt 120 lb (54.4 kg)   SpO2 100%   BMI 17.72 kg/m  No data found.   Physical Exam  Constitutional: He is oriented to person, place, and time. He appears well-developed and well-nourished. No distress.  HENT:  Head: Normocephalic and atraumatic.  Examination of the left forehead shows a erythematous vascular rash over the forehead and frontal scalp. It does not affect the eye at this time but there is one erythematous lesion in the high parietal laterally. Is no tearing of the eye.  Eyes: EOM are normal. Pupils are equal, round, and reactive to light. Right eye exhibits no discharge. Left eye exhibits no discharge.  Neck: Normal range of motion. Neck supple.  Musculoskeletal: Normal range of motion.  Neurological: He is alert and oriented to person, place, and time.  Skin: Skin is warm and dry. Rash noted. He is not diaphoretic.  Referred to eye  exam and HEENT exam  Psychiatric: He has a normal mood and affect. His behavior is normal. Judgment and thought content normal.  Nursing note and vitals reviewed.   Urgent Care Course     Procedures (including critical care time)  Labs Review Labs Reviewed - No data to display  Imaging Review No results found.   Visual Acuity Review  Right Eye  Distance:   Left Eye Distance:   Bilateral Distance:    Right Eye Near:   Left Eye Near:    Bilateral Near:         MDM   1. Herpes zoster with ophthalmic complication, unspecified herpes zoster eye disease    Discharge Medication List as of 11/22/2016  9:51 PM    START taking these medications   Details  famciclovir (FAMVIR) 500 MG tablet Take 1 tablet (500 mg total) by mouth 2 (two) times daily., Starting Fri 11/22/2016, Normal      Plan: 1. Test/x-ray results and diagnosis reviewed with patient 2. rx as per orders; risks, benefits, potential side effects reviewed with patient 3. Recommend  supportive treatment with Tylenol for pain since the patient does have stage III kidney disease with a creatinine clearance listed as 30-59. Ordered this reason I I will decrease the Famvir to 500 twice a day for this reason. If his eye does become involved over the weekend they should go immediately to the emergency department. Otherwise they should contact [facility] for evaluation on Monday. 4. F/u prn if symptoms worsen or don't improve     Lorin Picket, PA-C 11/22/16 2202

## 2016-11-22 NOTE — ED Triage Notes (Signed)
Patient c/o painful spots on the left side of his forehead that started today.

## 2016-11-25 ENCOUNTER — Other Ambulatory Visit: Payer: Self-pay | Admitting: Internal Medicine

## 2016-12-02 ENCOUNTER — Other Ambulatory Visit: Payer: Self-pay | Admitting: Internal Medicine

## 2016-12-02 DIAGNOSIS — K200 Eosinophilic esophagitis: Secondary | ICD-10-CM

## 2016-12-04 ENCOUNTER — Telehealth: Payer: Self-pay

## 2016-12-04 NOTE — Telephone Encounter (Signed)
Angy from downstairs called about pt. Heart doctor told pt if went 2 weeks and felt better then can take just simvastatin medication. Calling to find out if can be without medication in total? Wondering if we could draw labs to check cholesterol to find out. Please advise.

## 2016-12-06 ENCOUNTER — Other Ambulatory Visit: Payer: Self-pay | Admitting: Internal Medicine

## 2016-12-08 NOTE — Telephone Encounter (Signed)
It sounds like Mason Kane does not like the Crestor.  If Cardiology said he could change back Simvastatin then he is not worried about the cholesterol level - just wants Mason Kane to have improved quality of life.  Therefore, no reason to check labs.  As far as stopping medication all together, that is not advised due to his cardiac history.

## 2016-12-11 NOTE — Telephone Encounter (Signed)
Angy Bass informed of response and verbalized understanding.

## 2016-12-18 LAB — POCT ERYTHROCYTE SEDIMENTATION RATE, NON-AUTOMATED: Sed Rate: 7

## 2016-12-18 LAB — LIPID PANEL
Cholesterol: 126 (ref 0–200)
HDL: 45 (ref 35–70)
LDL CALC: 68

## 2016-12-18 LAB — CBC AND DIFFERENTIAL
HCT: 33 — AB (ref 41–53)
HEMOGLOBIN: 10.1 — AB (ref 13.5–17.5)

## 2016-12-18 LAB — BASIC METABOLIC PANEL
Creatinine: 1.7 — AB (ref 0.6–1.3)
GLUCOSE: 111

## 2016-12-18 LAB — TSH: TSH: 3.16 (ref 0.41–5.90)

## 2016-12-31 ENCOUNTER — Encounter: Payer: Self-pay | Admitting: Internal Medicine

## 2016-12-31 ENCOUNTER — Ambulatory Visit (INDEPENDENT_AMBULATORY_CARE_PROVIDER_SITE_OTHER): Payer: Medicare Other | Admitting: Internal Medicine

## 2016-12-31 VITALS — BP 132/78 | HR 51 | Ht 69.0 in | Wt 128.2 lb

## 2016-12-31 DIAGNOSIS — I10 Essential (primary) hypertension: Secondary | ICD-10-CM

## 2016-12-31 DIAGNOSIS — R531 Weakness: Secondary | ICD-10-CM | POA: Diagnosis not present

## 2016-12-31 DIAGNOSIS — K7460 Unspecified cirrhosis of liver: Secondary | ICD-10-CM | POA: Diagnosis not present

## 2016-12-31 DIAGNOSIS — Z0000 Encounter for general adult medical examination without abnormal findings: Secondary | ICD-10-CM | POA: Diagnosis not present

## 2016-12-31 DIAGNOSIS — N183 Type 2 diabetes mellitus with diabetic chronic kidney disease: Secondary | ICD-10-CM

## 2016-12-31 DIAGNOSIS — F321 Major depressive disorder, single episode, moderate: Secondary | ICD-10-CM

## 2016-12-31 DIAGNOSIS — E1122 Type 2 diabetes mellitus with diabetic chronic kidney disease: Secondary | ICD-10-CM

## 2016-12-31 DIAGNOSIS — R768 Other specified abnormal immunological findings in serum: Secondary | ICD-10-CM

## 2016-12-31 NOTE — Patient Instructions (Addendum)
Health Maintenance  Topic Date Due  . FOOT EXAM  11/28/2016  . URINE MICROALBUMIN  11/28/2016  . HEMOGLOBIN A1C  12/02/2016  . INFLUENZA VACCINE  02/05/2017  . OPHTHALMOLOGY EXAM  08/09/2017  . TETANUS/TDAP  01/01/2026  . PNA vac Low Risk Adult  Completed    Take Rosuvastatin daily.  If it is causing weakness, fatigue, stomach pain, etc, stop it and take Simvastatin instead.  Continue Brilinta until supply is gone then begin Clopidogrel (Plavix).

## 2016-12-31 NOTE — Progress Notes (Signed)
Patient: Mason Sikorski., Male    DOB: 1925-06-02, 83 y.o.   MRN: [id number] Visit Date: 12/31/2016  Today's Provider: Halina Maidens, MD   Chief Complaint  Patient presents with  . Medicare Wellness    Wants to discuss letter signed by Doctor stating if he can take rehab again or not.   . Hypertension  . Diabetes   Subjective:    Annual wellness visit Mason Zane. is a 83 y.o. male who presents today for his Subsequent Annual Wellness Visit. He feels fairly well. He reports exercising none due to weakness but he has not had a recent fall. He reports he is sleeping fairly well.   ----------------------------------------------------------- Hypertension  This is a chronic problem. The problem is controlled. Pertinent negatives include no chest pain, headaches, palpitations or shortness of breath. Risk factors for coronary artery disease include diabetes mellitus and dyslipidemia. Past treatments include direct vasodilators. Hypertensive end-organ damage includes kidney disease and CAD/MI. Identifiable causes of hypertension include a thyroid problem.  Diabetes  He presents for his follow-up diabetic visit. He has type 2 diabetes mellitus. Pertinent negatives for hypoglycemia include no dizziness, headaches or nervousness/anxiousness. Associated symptoms include fatigue, foot ulcerations (has now healed) and weakness. Pertinent negatives for diabetes include no chest pain and no polydipsia. Current diabetic treatment includes oral agent (monotherapy). He is compliant with treatment all of the time. His weight is stable. His breakfast blood glucose is taken between 6-7 am. His breakfast blood glucose range is generally 110-130 mg/dl. His dinner blood glucose is taken between 7-8 pm. His dinner blood glucose range is generally 180-200 mg/dl. An ACE inhibitor/angiotensin II receptor blocker is contraindicated. Eye exam is current.  Thyroid Problem  Presents for follow-up visit. Symptoms include  fatigue. Patient reports no anxiety, constipation or palpitations. The symptoms have been stable. His past medical history is significant for hyperlipidemia.  Hyperlipidemia  This is a chronic problem. The problem is controlled. Pertinent negatives include no chest pain or shortness of breath. Treatments tried: stopped crestor due to concern over sleepiness or fatigue but never started back or started zocor as instructed. The current treatment provides significant improvement of lipids.  Chronic nausea - he has zofran but has not taken it in several months.  He is on protonix and daily prednisone for control of presumed eosinophilic esophagitis contributing to n/v and weight loss.  Weight is up 8 lbs since last visit. He is followed by GI for this and for hepatic cirrhosis - no specific therapy currently. CAD - stable without chest pain or change in SOB at this time.  Still on Brilinta and will transition to Plavix when current supply exhausted.  Lab Results  Component Value Date   CHOL 126 12/18/2016   HDL 45 12/18/2016   LDLCALC 68 12/18/2016   TRIG 108 11/29/2015   CHOLHDL 2.3 11/29/2015   Lab Results  Component Value Date   CREATININE 1.7 (A) 12/18/2016   Lab Results  Component Value Date   TSH 3.16 12/18/2016     Review of Systems  Constitutional: Positive for fatigue. Negative for chills, fever and unexpected weight change.  HENT: Positive for postnasal drip (when exposed to son's cat) and sneezing. Negative for congestion and trouble swallowing.   Respiratory: Negative for chest tightness, shortness of breath and wheezing.   Cardiovascular: Positive for leg swelling. Negative for chest pain and palpitations.  Gastrointestinal: Positive for nausea and vomiting (occasional). Negative for abdominal distention, abdominal pain, blood in stool  and constipation.  Endocrine: Negative for polydipsia.  Genitourinary: Negative for dysuria and hematuria.  Musculoskeletal: Positive for  arthralgias and gait problem.  Skin: Negative for color change and rash.  Allergic/Immunologic: Positive for environmental allergies.  Neurological: Positive for weakness. Negative for dizziness, syncope, numbness and headaches.  Psychiatric/Behavioral: Negative for sleep disturbance. The patient is not nervous/anxious.     Social History   Social History  . Marital status: Widowed    Spouse name: N/A  . Number of children: N/A  . Years of education: N/A   Occupational History  . Not on file.   Social History Main Topics  . Smoking status: Never Smoker  . Smokeless tobacco: Never Used  . Alcohol use No  . Drug use: No  . Sexual activity: Not on file   Other Topics Concern  . Not on file   Social History Narrative  . No narrative on file    Patient Active Problem List   Diagnosis Date Noted  . Closed fracture of great toe of right foot 08/18/2016  . Pulsatile neck mass 12/19/2015  . Type 2 diabetes mellitus with stage 3 chronic kidney disease, without long-term current use of insulin ([place]) 11/29/2015  . Uncontrollable vomiting 10/13/2015  . Eosinophilic esophagitis 12/87/8676  . Dysphagia 09/27/2015  . Acquired hypothyroidism 11/14/2014  . Anemia due to chronic kidney disease 11/14/2014  . 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 Chronic renal insufficiency, stage III (moderate) 11/14/2014  . CAD in native artery 11/14/2014  . Dyslipidemia 11/14/2014  . Essential (primary) hypertension 11/14/2014  . Acid reflux 11/14/2014  . Cirrhosis ([place]) 11/14/2014  . Cardiomyopathy, ischemic 11/14/2014  . Depression, major, single episode, moderate ([place]) 11/14/2014  . Idiopathic insomnia 11/14/2014  . Arthritis of knee, degenerative 11/14/2014  . Purpura senilis ([place]) 11/14/2014  . Aortic atherosclerosis ([place]) 11/14/2014  . Weight loss 11/14/2014  . Red blood cell antibody positive 02/18/2014    Past Surgical History:  Procedure Laterality Date  . CARDIAC DEFIBRILLATOR  PLACEMENT  2015  . CORONARY ANGIOPLASTY WITH STENT PLACEMENT  12/2010  . CORONARY ANGIOPLASTY WITH STENT PLACEMENT  09/2011   DES placed  . CORONARY ARTERY BYPASS GRAFT  1987   5 vessel    His family history includes CAD in his father; Diabetes in his brother; Stroke in his mother.     Previous Medications   ASPIRIN EC 81 MG TABLET    Take by mouth.   CARVEDILOL (COREG) 6.25 MG TABLET    Take 1 tablet by mouth 2 (two) times daily.   EPOETIN ALFA (EPOGEN,PROCRIT) 4000 UNIT/ML INJECTION    Inject 4,000 Units into the skin every 14 (fourteen) days.   FINASTERIDE (PROSCAR) 5 MG TABLET    TAKE 1 TABLET (5 MG TOTAL) BY MOUTH DAILY.   FUROSEMIDE (LASIX) 20 MG TABLET    Take by mouth.   GLIPIZIDE (GLUCOTROL) 5 MG TABLET    TAKE 1/2 A TABLET BY MOUTH EVERY MORNING BEFORE BREAKFAST.   GLUCOSE BLOOD TEST STRIP    1 each by Other route 2 (two) times daily.   HYDRALAZINE (APRESOLINE) 100 MG TABLET    Take 1 tablet by mouth 3 (three) times daily.   LEVOTHYROXINE (SYNTHROID, LEVOTHROID) 125 MCG TABLET    Take 150 mcg by mouth.    MULTIPLE VITAMINS-MINERALS (MULTI COMPLETE PO)    Take by mouth.   NITROGLYCERIN (NITROSTAT) 0.4 MG SL TABLET    Place 1 tablet under the tongue as needed.   ONDANSETRON (  ZOFRAN-ODT) 8 MG DISINTEGRATING TABLET    Take by mouth.   ONETOUCH DELICA LANCETS 13K MISC       PANTOPRAZOLE (PROTONIX) 40 MG TABLET    TAKE 1 TABLET TWICE A DAY 30 TO 60 MINUTES PRIOR TO BREAKFAST AND DINNER   PREDNISONE (DELTASONE) 5 MG TABLET    TAKE 1 TABLET (5 MG TOTAL) BY MOUTH DAILY WITH BREAKFAST.   ROSUVASTATIN (CRESTOR) 20 MG TABLET    TAKE 1 TABLET BY MOUTH AT BEDTIME FOR HIGH CHOLESTEROL   SERTRALINE (ZOLOFT) 25 MG TABLET    TAKE 1 TABLET (25 MG TOTAL) BY MOUTH DAILY.   TAMSULOSIN (FLOMAX) 0.4 MG CAPS CAPSULE    TAKE 1 CAPSULE TWICE A DAY   THIAMINE 100 MG TABLET    Take 1 tablet by mouth daily.    Patient Care Team: Glean Hess, MD as PCP - General (Family Medicine) Sharlotte Alamo, DPM  (Podiatry) Franklyn Lor, MD as Referring Physician (Nephrology) Redmond Baseman, MD as Referring Physician (Cardiothoracic Surgery) Deal, Verlan Friends (Gastroenterology) Abisogun, Domenica Reamer, MD as Referring Physician (Endocrinology)      Objective:   Vitals: BP 132/78   Pulse (!) 51   Ht 5\' 9"  (1.753 m)   Wt 128 lb 3.2 oz (58.2 kg)   SpO2 99%   BMI 18.93 kg/m   Physical Exam  Constitutional: He is oriented to person, place, and time. He appears well-developed. No distress.  HENT:  Head: Normocephalic and atraumatic.  Cardiovascular: Normal rate and regular rhythm.   Murmur heard.  Systolic murmur is present with a grade of 3/6  Pulses:      Dorsalis pedis pulses are 1+ on the right side, and 1+ on the left side.       Posterior tibial pulses are 1+ on the right side, and 1+ on the left side.  Pulmonary/Chest: Effort normal and breath sounds normal. No respiratory distress. He has no wheezes. He has no rhonchi.  Abdominal: Soft. There is no tenderness.  Musculoskeletal:       Right knee: He exhibits decreased range of motion.       Left knee: He exhibits decreased range of motion.  Neurological: He is alert and oriented to person, place, and time. He displays no tremor. Abnormal gait: uses cane for balance and had trouble getting onto exam table.  Skin: Skin is warm and dry. No rash noted.  Psychiatric: He has a normal mood and affect. His speech is normal and behavior is normal. Thought content normal.  Nursing note and vitals reviewed.   Activities of Daily Living In your present state of health, do you have any difficulty performing the following activities: 12/31/2016  Hearing? N  Vision? N  Difficulty concentrating or making decisions? N  Walking or climbing stairs? Y  Dressing or bathing? N  Doing errands, shopping? Y  Some recent data might be hidden    Fall Risk Assessment Fall Risk  12/31/2016 01/02/2016 11/29/2015 09/27/2015  Falls in the past year? Yes Yes  No No  Number falls in past yr: 1 1 - -  Injury with Fall? Yes Yes - -  Risk Factor Category  High Fall Risk High Fall Risk - -  Follow up - Falls prevention discussed - -    Depression Screen PHQ 2/9 Scores 12/31/2016 12/31/2016 01/02/2016 11/29/2015  PHQ - 2 Score 1 0 0 0   6CIT Screen 12/31/2016  What Year? 0 points  What month? 0 points  What time?  0 points  Count back from 20 0 points  Months in reverse 0 points  Repeat phrase 6 points  Total Score 6     Medicare Annual Wellness Visit Summary:  Reviewed patient's Family Medical History Reviewed and updated list of patient's medical providers Assessment of cognitive impairment was done Assessed patient's functional ability Established a written schedule for health screening [place] Completed and Reviewed  Exercise Activities and Dietary recommendations Goals    None      Immunization History  Administered Date(s) Administered  . Hep A / Hep B 12/04/2016, 12/24/2016  . Influenza,inj,Quad PF,36+ Mos 05/08/2015, 04/02/2016  . Pneumococcal Conjugate-13 11/29/2015  . Pneumococcal Polysaccharide-23 12/07/2011  . Tdap 01/02/2016    Health Maintenance  Topic Date Due  . FOOT EXAM  11/28/2016  . URINE MICROALBUMIN  11/28/2016  . HEMOGLOBIN A1C  12/02/2016  . INFLUENZA VACCINE  02/05/2017  . OPHTHALMOLOGY EXAM  08/09/2017  . TETANUS/TDAP  01/01/2026  . PNA vac Low Risk Adult  Completed    Discussed health benefits of physical activity, and encouraged him to engage in regular exercise appropriate for his age and condition.    ------------------------------------------------------------------------------------------------------------  Assessment & Plan:  1. Medicare annual wellness visit, subsequent Measures satisfied  2. Cirrhosis of liver without ascites, unspecified hepatic cirrhosis type ([place]) Followed by GI - unclear any specific treatment plan  3. Essential (primary)  hypertension controlled  4. Type 2 diabetes mellitus with stage 3 chronic kidney disease, without long-term current use of insulin (HCC) Recent labs at [facility] normal  5. Depression, major, single episode, moderate ([place]) Doing well on Zoloft Coping well with wife's death  6. Red blood cell antibody positive Getting Epogen every 2 weeks from Hematology  7. Weakness generalized - Ambulatory referral to Physical Therapy   No orders of the defined types were placed in this encounter.   Halina Maidens, MD [facility]  12/31/2016

## 2017-01-06 ENCOUNTER — Other Ambulatory Visit: Payer: Self-pay | Admitting: Internal Medicine

## 2017-02-13 ENCOUNTER — Other Ambulatory Visit: Payer: Self-pay

## 2017-02-17 MED ORDER — GLIPIZIDE 5 MG PO TABS: ORAL_TABLET | ORAL | 4 refills | Status: DC

## 2017-02-20 IMAGING — US US SOFT TISSUE HEAD/NECK
1 series · 12 of 12 positions shown · non-contrast
Comparison: None.

CLINICAL DATA: Left pulsatile neck mass

EXAM:
ULTRASOUND OF HEAD/NECK SOFT TISSUES
TECHNIQUE: Ultrasound examination of the head and neck soft tissues was
performed in the area of clinical concern.

[Series 1: us soft tissue head/neck · 0.07mm/px · 12 of 12 slices shown]
[im 1/12]
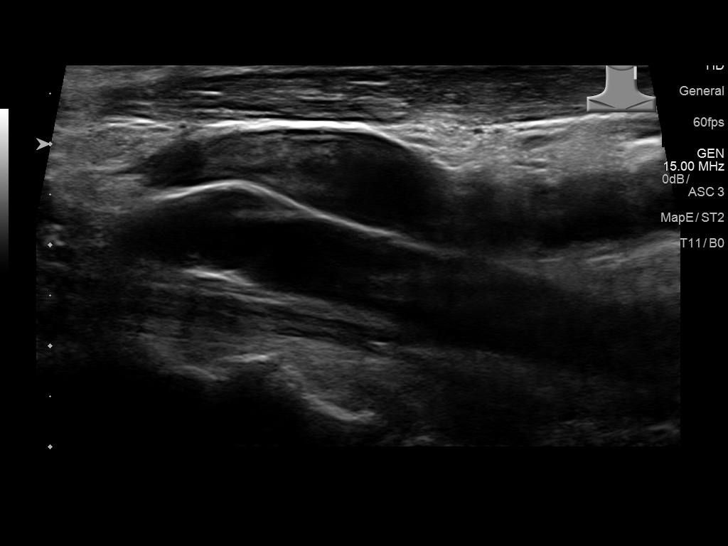
[im 2/12]
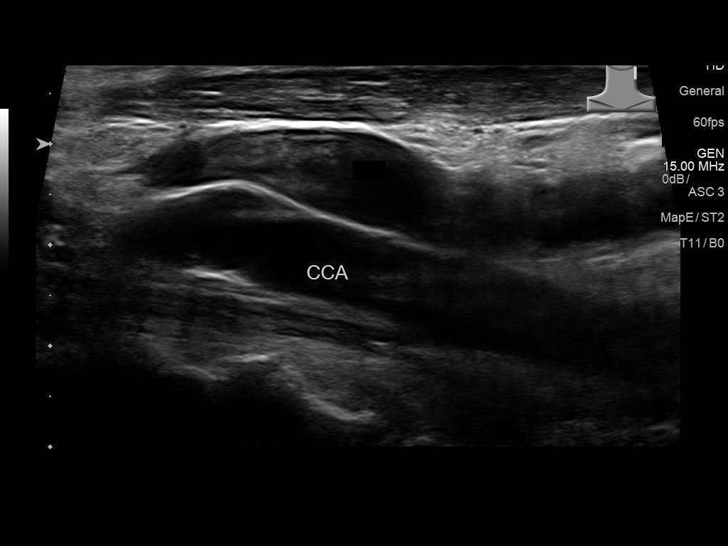
[im 3/12]
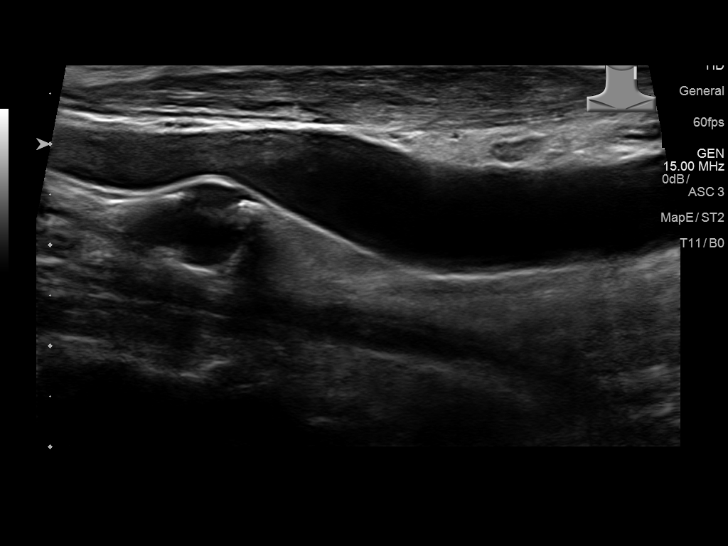
[im 4/12]
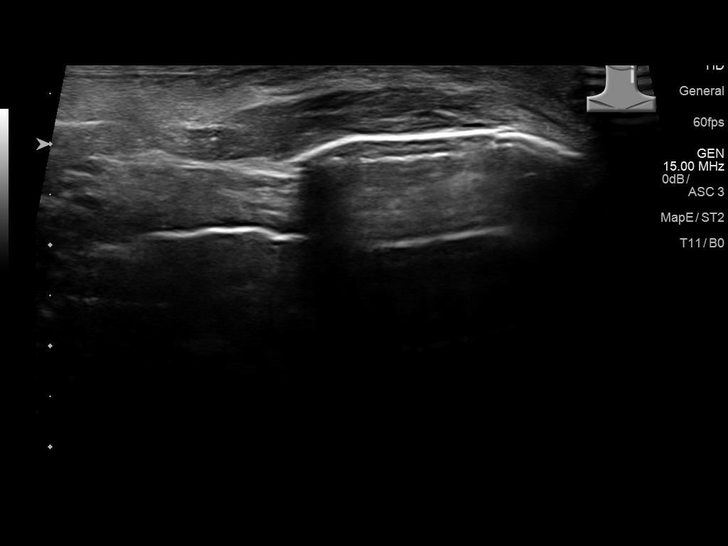
[im 5/12]
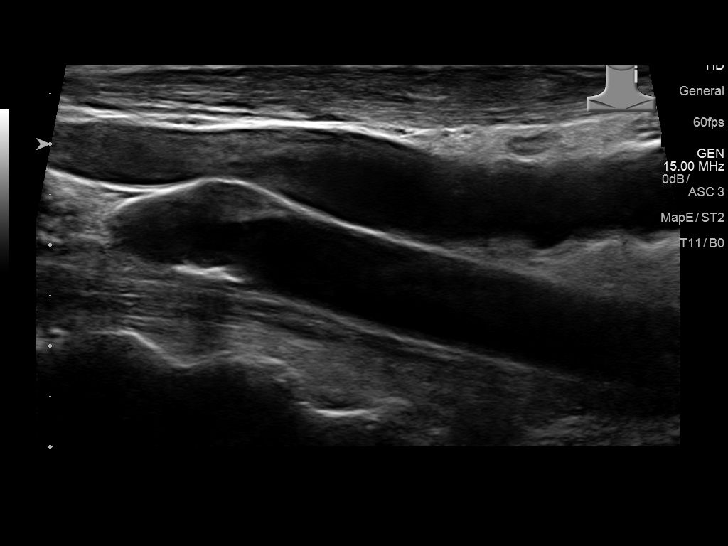
[im 6/12]
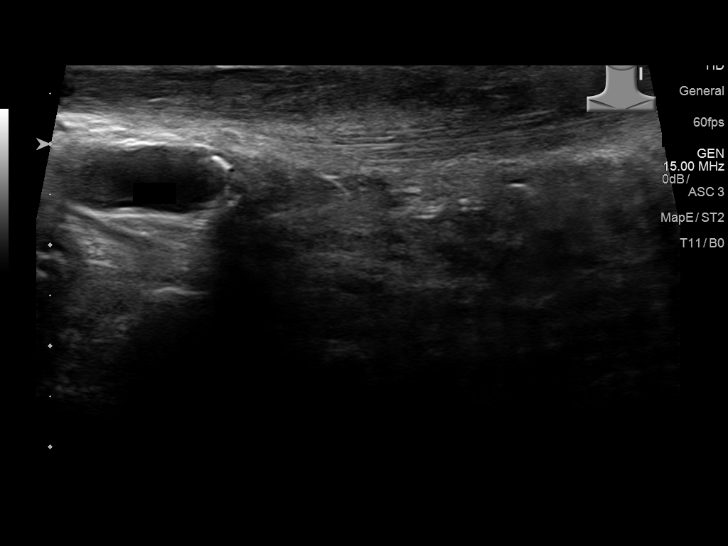
[im 7/12]
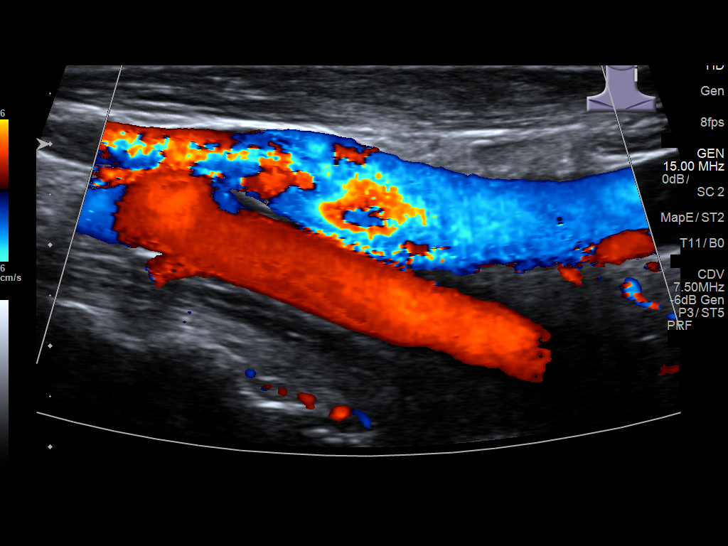
[im 8/12]
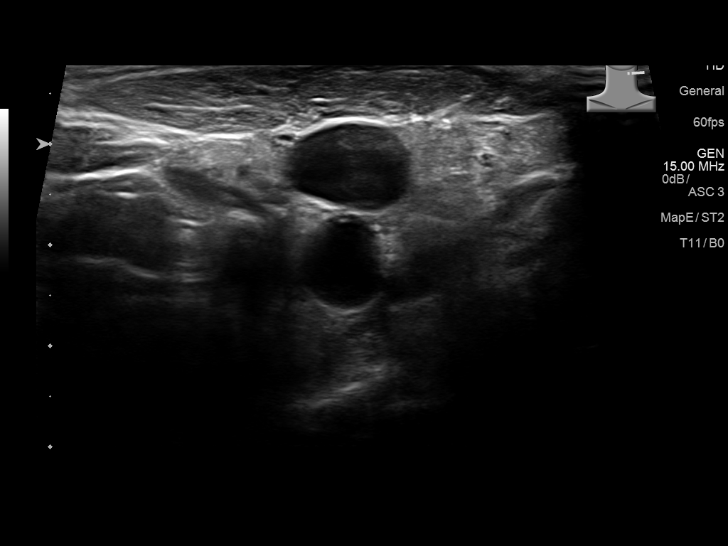
[im 9/12]
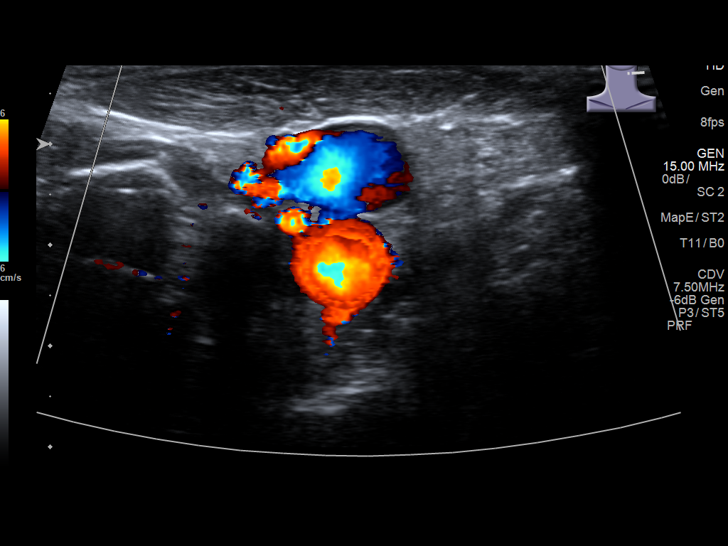
[im 10/12]
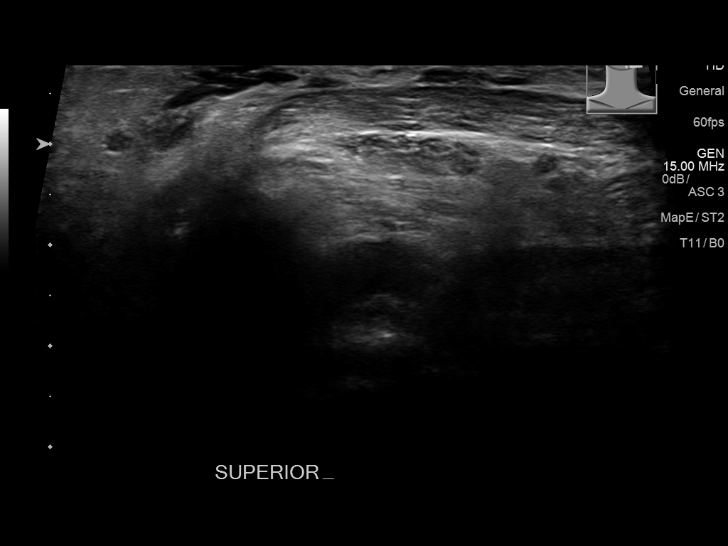
[im 11/12]
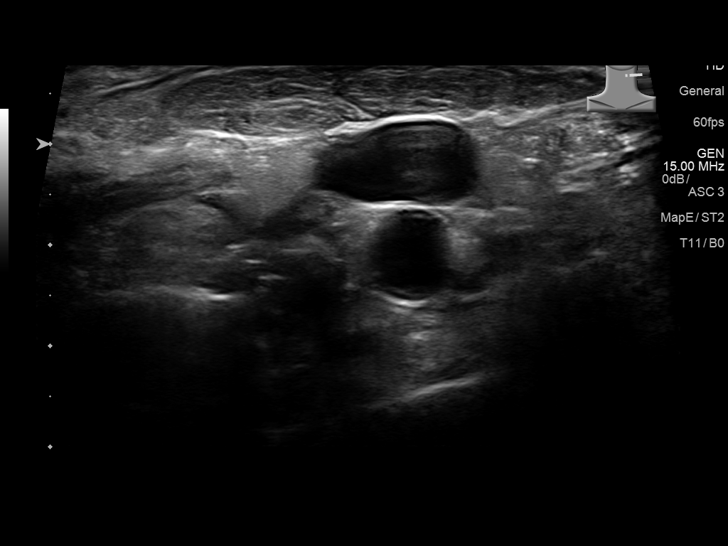
[im 12/12]
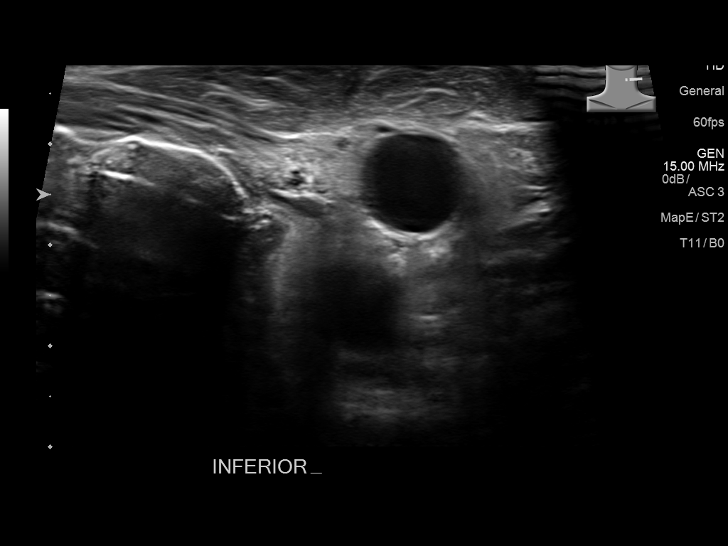

[12 of 12 positions shown; findings below may reference images not displayed]

FINDINGS: Palpable region corresponds to the carotid bulb. Some scattered
plaque is noted although dedicated carotid ultrasound with
velocities to assess degree of stenosis not performed . No mass,
cyst, abscess, adenopathy, or other pathologic findings.
IMPRESSION: 1. Atheromatous carotid bulb corresponds to palpable region. No
other pathology is identified.

## 2017-02-25 ENCOUNTER — Encounter: Payer: Self-pay | Admitting: Internal Medicine

## 2017-02-25 DIAGNOSIS — B3781 Candidal esophagitis: Secondary | ICD-10-CM | POA: Insufficient documentation

## 2017-02-26 IMAGING — CR DG TOE GREAT 2+V*R*
1 series · 3 of 3 positions shown · non-contrast
Comparison: None.

CLINICAL DATA: Stubbed great toe and toenail fell off

EXAM:
RIGHT GREAT TOE

[Series 1: dg toe great right · 0.14mm/px · 3 of 3 slices shown]
[im 1/3]
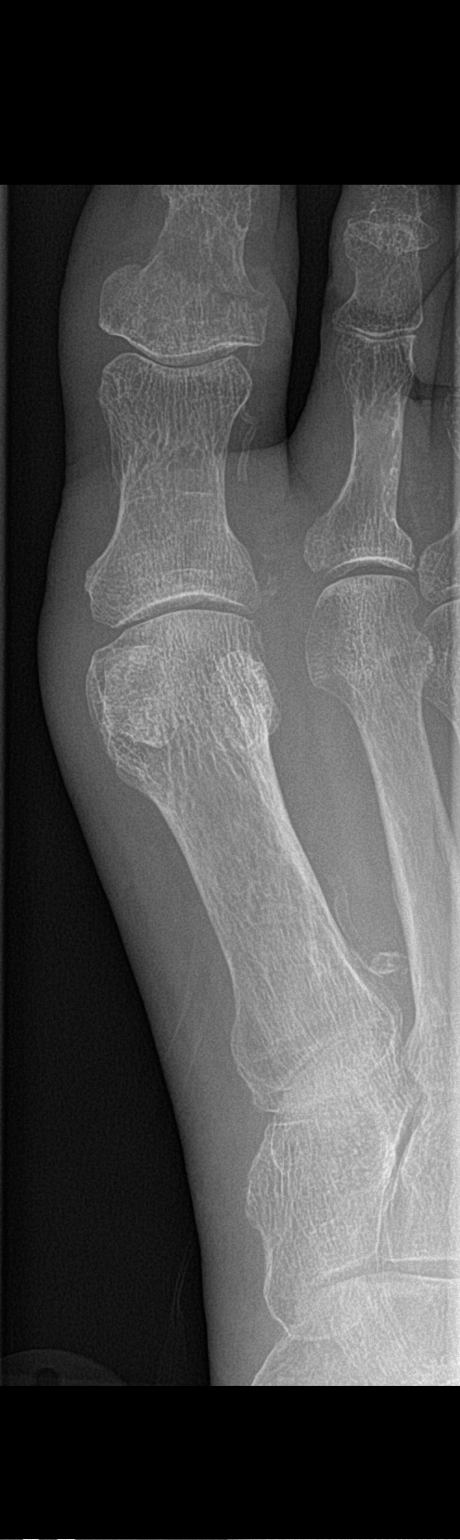
[im 2/3]
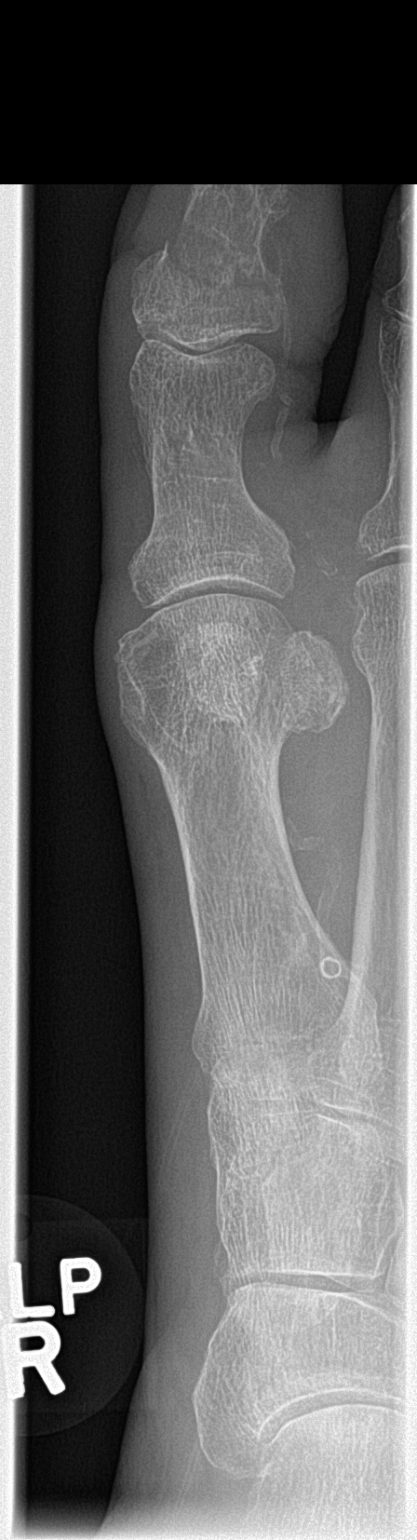
[im 3/3]
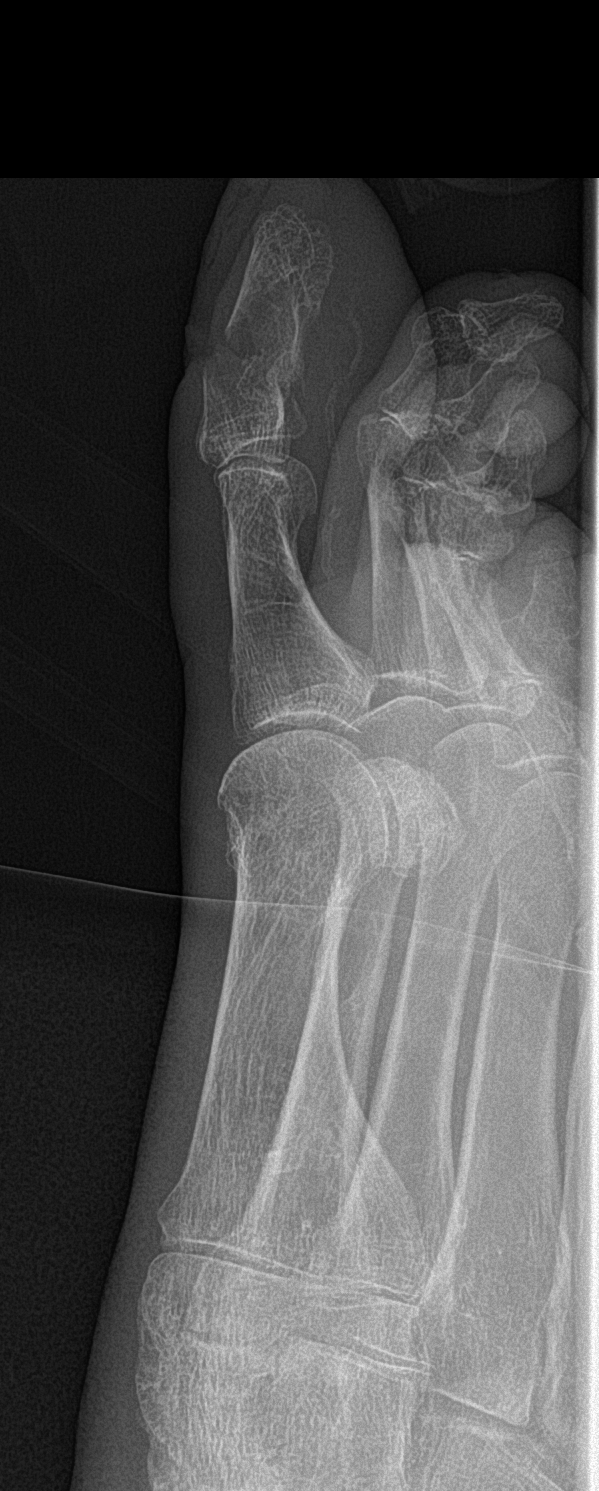

[3 of 3 positions shown; findings below may reference images not displayed]

FINDINGS: Bones appear osteopenic. Transverse fracture through the proximal
shaft of the first distal phalanx without definite articular
extension. [DATE] shaft plantar displacement of distal fracture
fragment. No subluxation. Vascular calcifications in the soft
tissues.
IMPRESSION: Mildly displaced fracture involving the mid to proximal portion of
the first distal phalanx.

Vascular calcification

## 2017-02-28 ENCOUNTER — Inpatient Hospital Stay: Payer: Medicare Other | Admitting: Internal Medicine

## 2017-03-08 HISTORY — PX: ERCP W/ SPHINCTEROTOMY AND BALLOON DILATION: SHX1524

## 2017-03-08 HISTORY — PX: IR PERC CHOLECYSTOSTOMY: IMG2326

## 2017-03-19 ENCOUNTER — Telehealth: Payer: Self-pay

## 2017-03-19 ENCOUNTER — Encounter: Payer: Self-pay | Admitting: Internal Medicine

## 2017-03-19 NOTE — Telephone Encounter (Signed)
Connie from [place] at [facility] called requesting verbal orders for patient to receive OT 2 X a week for 4 weeks. I approved verbal orders over the phone.

## 2017-04-01 ENCOUNTER — Other Ambulatory Visit: Payer: Self-pay | Admitting: Internal Medicine

## 2017-04-01 DIAGNOSIS — F321 Major depressive disorder, single episode, moderate: Secondary | ICD-10-CM

## 2017-04-01 DIAGNOSIS — K200 Eosinophilic esophagitis: Secondary | ICD-10-CM

## 2017-04-02 DIAGNOSIS — K8050 Calculus of bile duct without cholangitis or cholecystitis without obstruction: Secondary | ICD-10-CM | POA: Insufficient documentation

## 2017-04-18 ENCOUNTER — Encounter: Payer: Self-pay | Admitting: Internal Medicine

## 2017-04-18 ENCOUNTER — Telehealth: Payer: Self-pay

## 2017-04-18 ENCOUNTER — Ambulatory Visit (INDEPENDENT_AMBULATORY_CARE_PROVIDER_SITE_OTHER): Payer: Medicare Other | Admitting: Internal Medicine

## 2017-04-18 VITALS — BP 138/62 | HR 53 | Ht 69.0 in | Wt 118.0 lb

## 2017-04-18 DIAGNOSIS — N183 Chronic kidney disease, stage 3 unspecified: Secondary | ICD-10-CM

## 2017-04-18 DIAGNOSIS — K8050 Calculus of bile duct without cholangitis or cholecystitis without obstruction: Secondary | ICD-10-CM | POA: Diagnosis not present

## 2017-04-18 DIAGNOSIS — E1122 Type 2 diabetes mellitus with diabetic chronic kidney disease: Secondary | ICD-10-CM | POA: Diagnosis not present

## 2017-04-18 DIAGNOSIS — Z23 Encounter for immunization: Secondary | ICD-10-CM | POA: Diagnosis not present

## 2017-04-18 DIAGNOSIS — D631 Anemia in chronic kidney disease: Secondary | ICD-10-CM | POA: Diagnosis not present

## 2017-04-18 NOTE — Progress Notes (Signed)
Date:  04/18/2017   Name:  Mason Kane.   DOB:  [birth year]   MRN:  [id number]   Chief Complaint: Immunizations (High Dose) He was hospitalized recently with abdominal pain and elevated LFTs.  Found to have gall bladder stone and sludge.  Not a surgical candidate so a biliary tube was placed.  It continues to drain bile with sludge.  Since the tube placement, he has had no further nausea or vomiting.  He continues on all his usual medications, including the prednisone 5 mg. He does not eat well or drink enough fluids.  His family and caregivers are trying to encourage more fluids. Currently the tube is draining constantly to an external bag that he wears around his neck.  He has a consultation to see if the tube can be capped during the day.   Diabetes  He presents for his follow-up diabetic visit. He has type 2 diabetes mellitus. His disease course has been stable. Pertinent negatives for hypoglycemia include no dizziness or headaches. Associated symptoms include fatigue and weakness. Pertinent negatives for diabetes include no chest pain. His weight is stable. When asked about meal planning, he reported none. He never participates in exercise. An ACE inhibitor/angiotensin II receptor blocker is not being taken.  Hypertension  This is a chronic problem. The problem is controlled. Pertinent negatives include no chest pain, headaches or shortness of breath. Hypertensive end-organ damage includes kidney disease and CAD/MI.   Anemia of chronic disease - getting labs done and Procrit every week through hematology.   Review of Systems  Constitutional: Positive for fatigue. Negative for chills and fever.  Eyes: Negative for visual disturbance.  Respiratory: Negative for chest tightness and shortness of breath.   Cardiovascular: Negative for chest pain and leg swelling.  Gastrointestinal: Positive for abdominal pain. Negative for nausea and vomiting.  Genitourinary: Negative for difficulty  urinating.  Neurological: Positive for weakness. Negative for dizziness and headaches.  Psychiatric/Behavioral: Negative for sleep disturbance.    Patient Active Problem List   Diagnosis Date Noted  . Choledocholithiasis 04/02/2017  . Candida esophagitis ([place]) 02/25/2017  . Closed fracture of great toe of right foot 08/18/2016  . Pulsatile neck mass 12/19/2015  . Type 2 diabetes mellitus with stage 3 chronic kidney disease, without long-term current use of insulin ([place]) 11/29/2015  . Uncontrollable vomiting 10/13/2015  . Eosinophilic esophagitis 82/50/5397  . Dysphagia 09/27/2015  . Acquired hypothyroidism 11/14/2014  . Anemia due to chronic kidney disease 11/14/2014  . 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 Chronic renal insufficiency, stage III (moderate) ([place]) 11/14/2014  . CAD in native artery 11/14/2014  . Dyslipidemia 11/14/2014  . Essential (primary) hypertension 11/14/2014  . Acid reflux 11/14/2014  . Cirrhosis ([place]) 11/14/2014  . Cardiomyopathy, ischemic 11/14/2014  . Depression, major, single episode, moderate ([place]) 11/14/2014  . Idiopathic insomnia 11/14/2014  . Arthritis of knee, degenerative 11/14/2014  . Purpura senilis ([place]) 11/14/2014  . Aortic atherosclerosis ([place]) 11/14/2014  . Weight loss 11/14/2014  . Red blood cell antibody positive 02/18/2014    Prior to Admission medications   Medication Sig Start Date End Date Taking? Authorizing Provider  aspirin EC 81 MG tablet Take by mouth.   Yes [provider]  carvedilol (COREG) 6.25 MG tablet Take 1 tablet by mouth 2 (two) times daily. 08/11/15  Yes [provider]  epoetin alfa (EPOGEN,PROCRIT) 4000 UNIT/ML injection Inject 4,000 Units into the skin every 14 (fourteen) days.   Yes [provider]  finasteride (PROSCAR) 5 MG tablet TAKE 1 TABLET (5 MG TOTAL) BY MOUTH DAILY. 10/29/16  Yes Glean Hess, MD  furosemide (LASIX) 20 MG tablet Take by mouth. 08/23/16  Yes [provider]  glipiZIDE (GLUCOTROL) 5 MG tablet TAKE 1/2 A TABLET BY MOUTH EVERY MORNING BEFORE BREAKFAST. 02/17/17  Yes Glean Hess, MD  glucose blood test strip 1 each by Other route 2 (two) times daily. 07/25/14  Yes [provider]  hydrALAZINE (APRESOLINE) 100 MG tablet Take 1 tablet by mouth 3 (three) times daily.   Yes [provider]  levothyroxine (SYNTHROID, LEVOTHROID) 125 MCG tablet Take 150 mcg by mouth.    Yes [provider]  Multiple Vitamins-Minerals (MULTI COMPLETE PO) Take by mouth.   Yes [provider]  nitroGLYCERIN (NITROSTAT) 0.4 MG SL tablet Place 1 tablet under the tongue as needed.   Yes [provider]  Jonetta Speak LANCETS 31V [place]  07/17/15  Yes [provider]  pantoprazole (PROTONIX) 40 MG tablet TAKE 1 TABLET TWICE A DAY 30 TO 60 MINUTES PRIOR TO BREAKFAST AND DINNER 09/10/16  Yes [provider]  predniSONE (DELTASONE) 5 MG tablet TAKE 1 TABLET (5 MG TOTAL) BY MOUTH DAILY WITH BREAKFAST. 04/01/17  Yes Glean Hess, MD  rosuvastatin (CRESTOR) 20 MG tablet TAKE 1 TABLET BY MOUTH AT BEDTIME FOR HIGH CHOLESTEROL 10/14/16  Yes Glean Hess, MD  sertraline (ZOLOFT) 25 MG tablet TAKE 1 TABLET BY MOUTH EVERY DAY 04/01/17  Yes Glean Hess, MD  tamsulosin (FLOMAX) 0.4 MG CAPS capsule TAKE 1 CAPSULE TWICE A DAY 12/08/16  Yes Glean Hess, MD  thiamine 100 MG tablet Take 1 tablet by mouth daily. 08/02/14  Yes [provider]    Allergies  Allergen Reactions  . Remeron [Mirtazapine] Other (See Comments)    Excessive sedation    Past Surgical History:  Procedure Laterality Date  . CARDIAC DEFIBRILLATOR PLACEMENT  2015  . CORONARY ANGIOPLASTY WITH STENT PLACEMENT  12/2010  . CORONARY ANGIOPLASTY WITH STENT PLACEMENT  09/2011   DES placed  . CORONARY ARTERY BYPASS GRAFT  1987   5 vessel  . ERCP W/ SPHINCTEROTOMY AND BALLOON DILATION  03/2017  . IR PERC CHOLECYSTOSTOMY  03/2017     Social History  Substance Use Topics  . Smoking status: Never Smoker  . Smokeless tobacco: Never Used  . Alcohol use No     Medication list has been reviewed and updated.  PHQ 2/9 Scores 04/18/2017 12/31/2016 12/31/2016 01/02/2016  PHQ - 2 Score 0 1 0 0  PHQ- 9 Score 0 - - -    Physical Exam  Constitutional: He is oriented to person, place, and time. He appears cachectic. No distress.  HENT:  Head: Normocephalic and atraumatic.  Neck: Carotid bruit is not present. No thyromegaly present.  Cardiovascular: Normal rate and regular rhythm.   Pulmonary/Chest: Effort normal. No respiratory distress. He has decreased breath sounds. He has no wheezes. He has no rhonchi.  Abdominal:    Musculoskeletal: Normal range of motion.  Neurological: He is alert and oriented to person, place, and time.  Skin: Skin is warm and dry. Ecchymosis noted. No rash noted.  Psychiatric: He has a normal mood and affect. His speech is normal and behavior is normal. Thought content normal. Cognition and memory are normal.  Nursing note and vitals reviewed.   BP 138/62   Pulse (!) 53   Ht 5\' 9"  (1.753 m)   Wt 118  lb (53.5 kg)   SpO2 100%   BMI 17.43 kg/m   Assessment and Plan: 1. Type 2 diabetes mellitus with stage 3 chronic kidney disease, without long-term current use of insulin (HCC) Continue current medications Will not get labs today - difficult draw  2. Choledocholithiasis Continue with biliary tube and follow up for ongoing management  3. Anemia due to stage 3 chronic kidney disease ([place]) Getting Procrit as needed  4. Need for influenza vaccination - Flu vaccine HIGH DOSE PF   No orders of the defined types were placed in this encounter.   Partially dictated using Editor, commissioning. Any errors are unintentional.  Halina Maidens, MD [facility]  04/18/2017

## 2017-04-18 NOTE — Telephone Encounter (Signed)
Connie from [facility] called to request verbal orders to send to see Patient for twice a week for two more weeks. Gave verbal approval to send orders.

## 2017-05-02 ENCOUNTER — Ambulatory Visit: Payer: Medicare Other | Admitting: Internal Medicine

## 2017-05-06 ENCOUNTER — Ambulatory Visit: Payer: Medicare Other | Admitting: Internal Medicine

## 2017-05-06 ENCOUNTER — Other Ambulatory Visit: Payer: Self-pay

## 2017-05-06 DIAGNOSIS — E875 Hyperkalemia: Secondary | ICD-10-CM

## 2017-05-07 LAB — POTASSIUM: POTASSIUM: 4.7 mmol/L (ref 3.5–5.2)

## 2017-05-09 ENCOUNTER — Other Ambulatory Visit: Payer: Self-pay

## 2017-05-09 DIAGNOSIS — F321 Major depressive disorder, single episode, moderate: Secondary | ICD-10-CM

## 2017-05-09 MED ORDER — FINASTERIDE 5 MG PO TABS: 5.0000 mg | ORAL_TABLET | Freq: Every day | ORAL | 5 refills | Status: AC

## 2017-05-09 MED ORDER — SERTRALINE HCL 25 MG PO TABS: 25.0000 mg | ORAL_TABLET | Freq: Every day | ORAL | 1 refills | Status: DC

## 2017-05-09 MED ORDER — GLIPIZIDE 5 MG PO TABS: ORAL_TABLET | ORAL | 4 refills | Status: AC

## 2017-05-12 ENCOUNTER — Telehealth: Payer: Self-pay | Admitting: Internal Medicine

## 2017-05-12 NOTE — Telephone Encounter (Signed)
I don't see sodium bicarb on his list and do not know that he should be taking it.  Probably need to ask the original prescriber.

## 2017-05-12 NOTE — Telephone Encounter (Signed)
Pt needs refill sent to [facility] in [place] per Angie from downstairs.   Sodium Bicarb for kidney function?

## 2017-05-13 NOTE — Telephone Encounter (Signed)
Called Angie and informed Dr Army Melia is not comfortable with feeling his medication. They will need to contact nephrology and she understood.

## 2017-05-20 ENCOUNTER — Other Ambulatory Visit: Payer: Self-pay

## 2017-05-20 MED ORDER — FUROSEMIDE 20 MG PO TABS: 20.0000 mg | ORAL_TABLET | Freq: Every day | ORAL | 0 refills | Status: DC

## 2017-05-27 ENCOUNTER — Encounter (INDEPENDENT_AMBULATORY_CARE_PROVIDER_SITE_OTHER): Payer: Medicare Other | Admitting: Internal Medicine

## 2017-05-27 DIAGNOSIS — E1122 Type 2 diabetes mellitus with diabetic chronic kidney disease: Secondary | ICD-10-CM

## 2017-05-27 DIAGNOSIS — K2100 Gastro-esophageal reflux disease with esophagitis, without bleeding: Secondary | ICD-10-CM

## 2017-05-27 DIAGNOSIS — I700 Atherosclerosis of aorta: Secondary | ICD-10-CM

## 2017-05-27 DIAGNOSIS — Z48815 Encounter for surgical aftercare following surgery on the digestive system: Secondary | ICD-10-CM

## 2017-05-27 DIAGNOSIS — D631 Anemia in chronic kidney disease: Secondary | ICD-10-CM

## 2017-05-27 DIAGNOSIS — K7469 Other cirrhosis of liver: Secondary | ICD-10-CM

## 2017-05-27 DIAGNOSIS — I255 Ischemic cardiomyopathy: Secondary | ICD-10-CM

## 2017-05-27 DIAGNOSIS — F321 Major depressive disorder, single episode, moderate: Secondary | ICD-10-CM

## 2017-05-27 DIAGNOSIS — E039 Hypothyroidism, unspecified: Secondary | ICD-10-CM

## 2017-05-27 DIAGNOSIS — N183 Chronic kidney disease, stage 3 unspecified: Secondary | ICD-10-CM

## 2017-05-27 DIAGNOSIS — I10 Essential (primary) hypertension: Secondary | ICD-10-CM

## 2017-05-27 DIAGNOSIS — N184 Chronic kidney disease, stage 4 (severe): Secondary | ICD-10-CM

## 2017-05-27 HISTORY — DX: Encounter for surgical aftercare following surgery on the digestive system: Z48.815

## 2017-05-27 NOTE — Progress Notes (Signed)
Received orders from [facility] Start of care 03/12/17 with certification through 05/11/17. Orders are reviewed, signed and faxed.

## 2017-06-04 ENCOUNTER — Encounter (INDEPENDENT_AMBULATORY_CARE_PROVIDER_SITE_OTHER): Payer: Medicare Other | Admitting: Internal Medicine

## 2017-06-04 DIAGNOSIS — R634 Abnormal weight loss: Secondary | ICD-10-CM

## 2017-06-04 DIAGNOSIS — F321 Major depressive disorder, single episode, moderate: Secondary | ICD-10-CM

## 2017-06-04 DIAGNOSIS — I10 Essential (primary) hypertension: Secondary | ICD-10-CM | POA: Diagnosis not present

## 2017-06-04 DIAGNOSIS — I255 Ischemic cardiomyopathy: Secondary | ICD-10-CM

## 2017-06-04 DIAGNOSIS — N183 Chronic kidney disease, stage 3 unspecified: Secondary | ICD-10-CM

## 2017-06-04 DIAGNOSIS — E039 Hypothyroidism, unspecified: Secondary | ICD-10-CM

## 2017-06-04 DIAGNOSIS — I2510 Atherosclerotic heart disease of native coronary artery without angina pectoris: Secondary | ICD-10-CM

## 2017-06-04 DIAGNOSIS — R1310 Dysphagia, unspecified: Secondary | ICD-10-CM

## 2017-06-04 DIAGNOSIS — D631 Anemia in chronic kidney disease: Secondary | ICD-10-CM

## 2017-06-04 DIAGNOSIS — N184 Chronic kidney disease, stage 4 (severe): Secondary | ICD-10-CM

## 2017-06-04 DIAGNOSIS — K7469 Other cirrhosis of liver: Secondary | ICD-10-CM

## 2017-06-04 DIAGNOSIS — K200 Eosinophilic esophagitis: Secondary | ICD-10-CM

## 2017-06-04 DIAGNOSIS — E1122 Type 2 diabetes mellitus with diabetic chronic kidney disease: Secondary | ICD-10-CM

## 2017-06-04 NOTE — Progress Notes (Signed)
Received orders from [place].  Start of care was 03/13/2017.  Orders for re-certification from 89/37/34 to 07/10/17.  Orders are reviewed, signed and faxed.

## 2017-06-13 ENCOUNTER — Ambulatory Visit (INDEPENDENT_AMBULATORY_CARE_PROVIDER_SITE_OTHER): Payer: Medicare Other | Admitting: Internal Medicine

## 2017-06-13 ENCOUNTER — Encounter: Payer: Self-pay | Admitting: Internal Medicine

## 2017-06-13 VITALS — BP 128/76 | HR 64 | Resp 16 | Ht 69.0 in | Wt 128.0 lb

## 2017-06-13 DIAGNOSIS — N183 Chronic kidney disease, stage 3 unspecified: Secondary | ICD-10-CM

## 2017-06-13 DIAGNOSIS — E1122 Type 2 diabetes mellitus with diabetic chronic kidney disease: Secondary | ICD-10-CM | POA: Diagnosis not present

## 2017-06-13 DIAGNOSIS — E039 Hypothyroidism, unspecified: Secondary | ICD-10-CM | POA: Diagnosis not present

## 2017-06-13 MED ORDER — TAMSULOSIN HCL 0.4 MG PO CAPS: 0.4000 mg | ORAL_CAPSULE | Freq: Two times a day (BID) | ORAL | 1 refills | Status: DC

## 2017-06-13 NOTE — Progress Notes (Signed)
Date:  06/13/2017   Name:  Mason Kane.   DOB:  [birth year]   MRN:  [id number]   Chief Complaint: Diabetes Diabetes  He presents for his follow-up diabetic visit. He has type 2 diabetes mellitus. His disease course has been stable. Pertinent negatives for hypoglycemia include no headaches or tremors. Pertinent negatives for diabetes include no chest pain, no fatigue, no polydipsia and no polyuria. Current diabetic treatment includes oral agent (monotherapy). He is compliant with treatment most of the time. His weight is increasing steadily. There is no change in his home blood glucose trend. His breakfast blood glucose is taken between 7-8 am. His breakfast blood glucose range is generally 130-140 mg/dl.   He saw the Endo for thyroid today and no changes were made. He is eating well and has gained 10 lbs. He feels fairly well. He still has the cholecystotomy tube - currently capped and doing well.  Plans to leave it in place for a while.   Lab Results  Component Value Date   HGBA1C 5.5 06/04/2016   Wt Readings from Last 3 Encounters:  06/13/17 128 lb (58.1 kg)  04/18/17 118 lb (53.5 kg)  12/31/16 128 lb 3.2 oz (58.2 kg)     Review of Systems  Constitutional: Negative for appetite change, fatigue and unexpected weight change.  Eyes: Negative for visual disturbance.  Respiratory: Negative for cough, shortness of breath and wheezing.   Cardiovascular: Negative for chest pain, palpitations and leg swelling.  Gastrointestinal: Negative for abdominal pain and blood in stool.  Endocrine: Negative for polydipsia and polyuria.  Genitourinary: Negative for dysuria and hematuria.  Skin: Negative for color change and rash.  Neurological: Negative for tremors, numbness and headaches.  Psychiatric/Behavioral: Negative for dysphoric mood.    Patient Active Problem List   Diagnosis Date Noted  . Encounter for surgical aftercare following surgery of digestive system 05/27/2017  .  Choledocholithiasis 04/02/2017  . Candida esophagitis ([place]) 02/25/2017  . Closed fracture of great toe of right foot 08/18/2016  . Pulsatile neck mass 12/19/2015  . Type 2 diabetes mellitus with stage 3 chronic kidney disease, without long-term current use of insulin ([place]) 11/29/2015  . Uncontrollable vomiting 10/13/2015  . Eosinophilic esophagitis 82/42/3536  . Dysphagia 09/27/2015  . Acquired hypothyroidism 11/14/2014  . Anemia due to chronic kidney disease 11/14/2014  . 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 Chronic renal insufficiency, stage III (moderate) ([place]) 11/14/2014  . CAD in native artery 11/14/2014  . Dyslipidemia 11/14/2014  . Essential (primary) hypertension 11/14/2014  . Acid reflux 11/14/2014  . Cirrhosis ([place]) 11/14/2014  . Cardiomyopathy, ischemic 11/14/2014  . Depression, major, single episode, moderate ([place]) 11/14/2014  . Idiopathic insomnia 11/14/2014  . Arthritis of knee, degenerative 11/14/2014  . Purpura senilis ([place]) 11/14/2014  . Aortic atherosclerosis ([place]) 11/14/2014  . Weight loss 11/14/2014  . Red blood cell antibody positive 02/18/2014    Prior to Admission medications   Medication Sig Start Date End Date Taking? Authorizing Provider  aspirin EC 81 MG tablet Take by mouth.    [provider]  carvedilol (COREG) 6.25 MG tablet Take 1 tablet by mouth 2 (two) times daily. 08/11/15   [provider]  epoetin alfa (EPOGEN,PROCRIT) 4000 UNIT/ML injection Inject 4,000 Units into the skin every 14 (fourteen) days.    [provider]  finasteride (PROSCAR) 5 MG tablet Take 1 tablet (5 mg total) by mouth daily. 05/09/17   Glean Hess, MD  furosemide (LASIX)  20 MG tablet Take 1 tablet (20 mg total) daily by mouth. 05/20/17   Glean Hess, MD  glipiZIDE (GLUCOTROL) 5 MG tablet TAKE 1/2 A TABLET BY MOUTH EVERY MORNING BEFORE BREAKFAST. 05/09/17   Glean Hess, MD  glucose blood test strip 1 each by Other route 2 (two)  times daily. 07/25/14   [provider]  hydrALAZINE (APRESOLINE) 100 MG tablet Take 1 tablet by mouth 3 (three) times daily.    [provider]  levothyroxine (SYNTHROID, LEVOTHROID) 125 MCG tablet Take 150 mcg by mouth.     [provider]  Multiple Vitamins-Minerals (MULTI COMPLETE PO) Take by mouth.    [provider]  nitroGLYCERIN (NITROSTAT) 0.4 MG SL tablet Place 1 tablet under the tongue as needed.    [provider]  Jonetta Speak LANCETS 42V [place]  07/17/15   [provider]  pantoprazole (PROTONIX) 40 MG tablet TAKE 1 TABLET TWICE A DAY 30 TO 60 MINUTES PRIOR TO BREAKFAST AND DINNER 09/10/16   [provider]  predniSONE (DELTASONE) 5 MG tablet TAKE 1 TABLET (5 MG TOTAL) BY MOUTH DAILY WITH BREAKFAST. 04/01/17   Glean Hess, MD  rosuvastatin (CRESTOR) 20 MG tablet TAKE 1 TABLET BY MOUTH AT BEDTIME FOR HIGH CHOLESTEROL 10/14/16   Glean Hess, MD  sertraline (ZOLOFT) 25 MG tablet Take 1 tablet (25 mg total) by mouth daily. 05/09/17   Glean Hess, MD  tamsulosin (FLOMAX) 0.4 MG CAPS capsule TAKE 1 CAPSULE TWICE A DAY 12/08/16   Glean Hess, MD  thiamine 100 MG tablet Take 1 tablet by mouth daily. 08/02/14   [provider]    Allergies  Allergen Reactions  . Remeron [Mirtazapine] Other (See Comments)    Excessive sedation    Past Surgical History:  Procedure Laterality Date  . CARDIAC DEFIBRILLATOR PLACEMENT  2015  . CORONARY ANGIOPLASTY WITH STENT PLACEMENT  12/2010  . CORONARY ANGIOPLASTY WITH STENT PLACEMENT  09/2011   DES placed  . CORONARY ARTERY BYPASS GRAFT  1987   5 vessel  . ERCP W/ SPHINCTEROTOMY AND BALLOON DILATION  03/2017  . IR PERC CHOLECYSTOSTOMY  03/2017    Social History   Tobacco Use  . Smoking status: Never Smoker  . Smokeless tobacco: Never Used  Substance Use Topics  . Alcohol use: No    Alcohol/week: 0.0 oz  . Drug use: No     Medication list has been reviewed  and updated.  PHQ 2/9 Scores 04/18/2017 12/31/2016 12/31/2016 01/02/2016  PHQ - 2 Score 0 1 0 0  PHQ- 9 Score 0 - - -    Physical Exam  Constitutional: He is oriented to person, place, and time. He appears well-developed. No distress.  HENT:  Head: Normocephalic and atraumatic.  Neck: Normal range of motion. Neck supple. No thyromegaly present.  Cardiovascular: Regular rhythm. Bradycardia present. Exam reveals no gallop.  Pulmonary/Chest: Effort normal. No respiratory distress. He has no decreased breath sounds. He has no wheezes. He has no rhonchi.  Abdominal: There is no tenderness.    Musculoskeletal: Normal range of motion. He exhibits no edema.  Neurological: He is alert and oriented to person, place, and time.  Skin: Skin is warm and dry. No rash noted.  Psychiatric: He has a normal mood and affect. His behavior is normal. Thought content normal.  Nursing note and vitals reviewed.   BP 128/76   Pulse 64   Resp 16   Ht 5\' 9"  (1.753 m)  Wt 128 lb (58.1 kg)   SpO2 98%   BMI 18.90 kg/m   Assessment and Plan: 1. Type 2 diabetes mellitus with stage 3 chronic kidney disease, without long-term current use of insulin (HCC) Continue oral medication - Hemoglobin A1c  2. Acquired hypothyroidism stable  3. Chronic renal insufficiency, stage III (moderate) (HCC) Followed by Nephrology   Meds ordered this encounter  Medications  . tamsulosin (FLOMAX) 0.4 MG CAPS capsule    Sig: Take 1 capsule (0.4 mg total) by mouth 2 (two) times daily.    Dispense:  180 capsule    Refill:  1    Partially dictated using Editor, commissioning. Any errors are unintentional.  Halina Maidens, MD [facility]  06/13/2017

## 2017-06-14 LAB — HEMOGLOBIN A1C
Est. average glucose Bld gHb Est-mCnc: 151 mg/dL
Hgb A1c MFr Bld: 6.9 % — ABNORMAL HIGH (ref 4.8–5.6)

## 2017-06-19 ENCOUNTER — Telehealth: Payer: Self-pay

## 2017-06-19 NOTE — Telephone Encounter (Signed)
Patient close friend Angy called-  Stated patient BS is spiking in the last week. This morning BS was 250 fasting. Even BS is ranging from 350-400. Wants advice and if we need to change his medications or add a new one. Patient has not been eating sweets.   Please Advise.  CB# [phone] - ANGY

## 2017-06-20 NOTE — Telephone Encounter (Signed)
Mason Kane stated they will do that for a week and report readings back then.

## 2017-06-20 NOTE — Telephone Encounter (Signed)
His last A1C was very good last week.  They need to take BS at least three times a day to see if there is a pattern to the elevations and report back next week.

## 2017-07-04 ENCOUNTER — Telehealth: Payer: Self-pay | Admitting: Internal Medicine

## 2017-07-04 ENCOUNTER — Telehealth: Payer: Self-pay

## 2017-07-04 NOTE — Telephone Encounter (Signed)
Received blood sugar log with elevated readings, especially in the evening.  I recommend adding a second dose of glipizide 2.5 mg in the evening.

## 2017-07-04 NOTE — Telephone Encounter (Signed)
Called friends Angy about this and informed her. She said he has been off of glipizide so does he start back up? Spoke to Dr Army Melia and told her to tell him to begin back on glipizide 1/2 tablet in the morning.

## 2017-07-04 NOTE — Telephone Encounter (Signed)
ERROR

## 2017-07-30 ENCOUNTER — Other Ambulatory Visit: Payer: Self-pay | Admitting: Internal Medicine

## 2017-07-30 DIAGNOSIS — K200 Eosinophilic esophagitis: Secondary | ICD-10-CM

## 2017-07-30 DIAGNOSIS — F321 Major depressive disorder, single episode, moderate: Secondary | ICD-10-CM

## 2017-10-14 LAB — HM DIABETES EYE EXAM

## 2017-10-15 ENCOUNTER — Encounter: Payer: Self-pay | Admitting: Internal Medicine

## 2017-10-17 ENCOUNTER — Ambulatory Visit (INDEPENDENT_AMBULATORY_CARE_PROVIDER_SITE_OTHER): Payer: Medicare Other | Admitting: Internal Medicine

## 2017-10-17 ENCOUNTER — Encounter: Payer: Self-pay | Admitting: Internal Medicine

## 2017-10-17 VITALS — BP 128/62 | HR 68 | Ht 69.0 in | Wt 124.0 lb

## 2017-10-17 DIAGNOSIS — F321 Major depressive disorder, single episode, moderate: Secondary | ICD-10-CM

## 2017-10-17 DIAGNOSIS — I700 Atherosclerosis of aorta: Secondary | ICD-10-CM | POA: Diagnosis not present

## 2017-10-17 DIAGNOSIS — N183 Chronic kidney disease, stage 3 unspecified: Secondary | ICD-10-CM

## 2017-10-17 DIAGNOSIS — E1122 Type 2 diabetes mellitus with diabetic chronic kidney disease: Secondary | ICD-10-CM | POA: Diagnosis not present

## 2017-10-17 DIAGNOSIS — D692 Other nonthrombocytopenic purpura: Secondary | ICD-10-CM | POA: Diagnosis not present

## 2017-10-17 DIAGNOSIS — K7460 Unspecified cirrhosis of liver: Secondary | ICD-10-CM | POA: Diagnosis not present

## 2017-10-17 MED ORDER — SERTRALINE HCL 25 MG PO TABS: 25.0000 mg | ORAL_TABLET | Freq: Every day | ORAL | 1 refills | Status: DC

## 2017-10-17 MED ORDER — FUROSEMIDE 20 MG PO TABS: 20.0000 mg | ORAL_TABLET | Freq: Every day | ORAL | 1 refills | Status: DC

## 2017-10-17 MED ORDER — NITROGLYCERIN 0.4 MG SL SUBL: 0.4000 mg | SUBLINGUAL_TABLET | SUBLINGUAL | 0 refills | Status: AC | PRN

## 2017-10-17 NOTE — Progress Notes (Addendum)
Date:  10/17/2017   Name:  Mason Kane.   DOB:  December 31, 1924   MRN:  [id number]   Chief Complaint: Diabetes (follow up) Diabetes  He presents for his follow-up diabetic visit. He has type 2 diabetes mellitus. His disease course has been stable. Current diabetic treatment includes oral agent (monotherapy). He is compliant with treatment all of the time. He is following a generally healthy diet. He monitors blood glucose at home 1-2 x per day. His home blood glucose trend is decreasing steadily. His breakfast blood glucose is taken between 7-8 am. His breakfast blood glucose range is generally 90-110 mg/dl. An ACE inhibitor/angiotensin II receptor blocker is not being taken.  Depression         This is a chronic problem.  The problem has been resolved since onset.  Past treatments include SSRIs - Selective serotonin reuptake inhibitors.  Compliance with treatment is good. Underweight - has been able to maintain his weight.  He still does not eat much but has less nausea and vomiting since the biliary tube was placed.  He drains it about once a month - gets between 75 - 200 ml. The plan is to keep the tube indefinitely.  He is also on low dose prednisone for eosinophilic esophagitis and would like to continue it since he is doing so well. Liver disease - stable Renal disease - stable GFR ~30.  On bicarbonate daily. Coronary and aortic atherosclerosis - recently seen by Cardiology and statin stopped due to age.  He also has a defibrillator.  Lab Results  Component Value Date   HGBA1C 6.9 (H) 06/13/2017    Wt Readings from Last 3 Encounters:  10/17/17 124 lb (56.2 kg)  06/13/17 128 lb (58.1 kg)  04/18/17 118 lb (53.5 kg)     Review of Systems  Psychiatric/Behavioral: Positive for depression.    Patient Active Problem List   Diagnosis Date Noted  . Encounter for surgical aftercare following surgery of digestive system 05/27/2017  . Choledocholithiasis 04/02/2017  . Candida esophagitis  ([place]) 02/25/2017  . Closed fracture of great toe of right foot 08/18/2016  . Pulsatile neck mass 12/19/2015  . Type 2 diabetes mellitus with stage 3 chronic kidney disease, without long-term current use of insulin ([place]) 11/29/2015  . Uncontrollable vomiting 10/13/2015  . Eosinophilic esophagitis 07/10/7251  . Dysphagia 09/27/2015  . Acquired hypothyroidism 11/14/2014  . Anemia due to chronic kidney disease 11/14/2014  . 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 Chronic renal insufficiency, stage III (moderate) ([place]) 11/14/2014  . CAD in native artery 11/14/2014  . Dyslipidemia 11/14/2014  . Essential (primary) hypertension 11/14/2014  . Acid reflux 11/14/2014  . Cirrhosis ([place]) 11/14/2014  . Cardiomyopathy, ischemic 11/14/2014  . Depression, major, single episode, moderate ([place]) 11/14/2014  . Idiopathic insomnia 11/14/2014  . Arthritis of knee, degenerative 11/14/2014  . Purpura senilis ([place]) 11/14/2014  . Aortic atherosclerosis ([place]) 11/14/2014  . Weight loss 11/14/2014  . Red blood cell antibody positive 02/18/2014    Prior to Admission medications   Medication Sig Start Date End Date Taking? Authorizing Provider  amLODipine (NORVASC) 2.5 MG tablet Take 1 tablet by mouth daily. 08/26/17 08/26/18 Yes [provider]  aspirin EC 81 MG tablet Take by mouth.   Yes [provider]  carvedilol (COREG) 3.125 MG tablet Take 1 tablet by mouth 2 (two) times daily. 02/19/17  Yes [provider]  epoetin alfa (EPOGEN,PROCRIT) 4000 UNIT/ML injection Inject 4,000 Units into the skin  every 14 (fourteen) days.   Yes [provider]  finasteride (PROSCAR) 5 MG tablet Take 1 tablet (5 mg total) by mouth daily. 05/09/17  Yes Glean Hess, MD  furosemide (LASIX) 20 MG tablet Take 1 tablet (20 mg total) daily by mouth. 05/20/17  Yes Glean Hess, MD  glipiZIDE (GLUCOTROL) 5 MG tablet TAKE 1/2 A TABLET BY MOUTH EVERY MORNING BEFORE BREAKFAST. 05/09/17  Yes  Glean Hess, MD  glucose blood test strip 1 each by Other route 2 (two) times daily. 07/25/14  Yes [provider]  hydrALAZINE (APRESOLINE) 100 MG tablet Take 1 tablet by mouth 3 (three) times daily.   Yes [provider]  levothyroxine (SYNTHROID, LEVOTHROID) 125 MCG tablet Take 150 mcg by mouth.    Yes [provider]  Multiple Vitamins-Minerals (MULTI COMPLETE PO) Take by mouth.   Yes [provider]  nitroGLYCERIN (NITROSTAT) 0.4 MG SL tablet Place 1 tablet under the tongue as needed.   Yes [provider]  ondansetron (ZOFRAN-ODT) 8 MG disintegrating tablet Take 1 tablet by mouth as needed.   Yes [provider]  Jonetta Speak LANCETS 13K [place]  07/17/15  Yes [provider]  pantoprazole (PROTONIX) 40 MG tablet TAKE 1 TABLET TWICE A DAY 30 TO 60 MINUTES PRIOR TO BREAKFAST AND DINNER 09/10/16  Yes [provider]  polyethylene glycol (MIRALAX / GLYCOLAX) packet Take 17 g by mouth daily.   Yes [provider]  predniSONE (DELTASONE) 5 MG tablet TAKE 1 TABLET BY MOUTH EVERY DAY WITH BREAKFAST 07/30/17  Yes Glean Hess, MD  sertraline (ZOLOFT) 25 MG tablet Take 1 tablet (25 mg total) by mouth daily. 05/09/17  Yes Glean Hess, MD  sodium bicarbonate 650 MG tablet Take 1 tablet by mouth 2 (two) times daily. 05/13/17  Yes [provider]  tamsulosin (FLOMAX) 0.4 MG CAPS capsule Take 1 capsule (0.4 mg total) by mouth 2 (two) times daily. 06/13/17  Yes Glean Hess, MD  thiamine 100 MG tablet Take 1 tablet by mouth daily. 08/02/14  Yes [provider]    Allergies  Allergen Reactions  . Remeron [Mirtazapine] Other (See Comments)    Excessive sedation    Past Surgical History:  Procedure Laterality Date  . CARDIAC DEFIBRILLATOR PLACEMENT  2015  . CORONARY ANGIOPLASTY WITH STENT PLACEMENT  12/2010  . CORONARY ANGIOPLASTY WITH STENT PLACEMENT  09/2011   DES placed  . CORONARY ARTERY  BYPASS GRAFT  1987   5 vessel  . ERCP W/ SPHINCTEROTOMY AND BALLOON DILATION  03/2017  . IR PERC CHOLECYSTOSTOMY  03/2017    Social History   Tobacco Use  . Smoking status: Never Smoker  . Smokeless tobacco: Never Used  Substance Use Topics  . Alcohol use: No    Alcohol/week: 0.0 oz  . Drug use: No     Medication list has been reviewed and updated.  PHQ 2/9 Scores 04/18/2017 12/31/2016 12/31/2016 01/02/2016  PHQ - 2 Score 0 1 0 0  PHQ- 9 Score 0 - - -    Physical Exam  Constitutional: He is oriented to person, place, and time. He appears well-developed. No distress.  HENT:  Head: Normocephalic and atraumatic.  Neck: Normal range of motion. Neck supple.  Cardiovascular: Normal rate, regular rhythm and intact distal pulses.  Pulmonary/Chest: Effort normal. No accessory muscle usage. No respiratory distress. He has decreased breath sounds. He has no wheezes. He has no rales. He exhibits no tenderness.  Abdominal: Soft. Normal appearance and bowel sounds are normal. There is no tenderness. No hernia.    Musculoskeletal: Normal range of motion.  Neurological: He is alert and oriented to person, place, and time.  Skin: Skin is warm and dry. Purpura (over both forearms) and rash noted.  Psychiatric: He has a normal mood and affect. His behavior is normal. Thought content normal.    BP 128/62   Pulse 68   Ht 5\' 9"  (1.753 m)   Wt 124 lb (56.2 kg)   BMI 18.31 kg/m   Assessment and Plan: 1. Type 2 diabetes mellitus with stage 3 chronic kidney disease, without long-term current use of insulin (HCC) Continue glipizide - Hemoglobin A1c  2. Depression, major, single episode, moderate (HCC) Doing well - continue medication - sertraline (ZOLOFT) 25 MG tablet; Take 1 tablet (25 mg total) by mouth daily.  Dispense: 90 tablet; Refill: 1  3. Cirrhosis of liver without ascites, unspecified hepatic cirrhosis type (HCC) Stable Biliary drain functioning well - furosemide (LASIX) 20 MG  tablet; Take 1 tablet (20 mg total) by mouth daily.  Dispense: 90 tablet; Refill: 1  4. Aortic atherosclerosis ([place]) Now off of statins - nitroGLYCERIN (NITROSTAT) 0.4 MG SL tablet; Place 1 tablet (0.4 mg total) under the tongue as needed.  Dispense: 25 tablet; Refill: 0  5. Purpura senilis ([place]) stable   Meds ordered this encounter  Medications  . furosemide (LASIX) 20 MG tablet    Sig: Take 1 tablet (20 mg total) by mouth daily.    Dispense:  90 tablet    Refill:  1  . sertraline (ZOLOFT) 25 MG tablet    Sig: Take 1 tablet (25 mg total) by mouth daily.    Dispense:  90 tablet    Refill:  1  . nitroGLYCERIN (NITROSTAT) 0.4 MG SL tablet    Sig: Place 1 tablet (0.4 mg total) under the tongue as needed.    Dispense:  25 tablet    Refill:  0    Partially dictated using Editor, commissioning. Any errors are unintentional.  Halina Maidens, MD [facility]  10/17/2017

## 2017-10-18 LAB — HEMOGLOBIN A1C
Est. average glucose Bld gHb Est-mCnc: 126 mg/dL
Hgb A1c MFr Bld: 6 % — ABNORMAL HIGH (ref 4.8–5.6)

## 2017-10-22 ENCOUNTER — Other Ambulatory Visit: Payer: Self-pay | Admitting: Internal Medicine

## 2017-11-06 ENCOUNTER — Ambulatory Visit (INDEPENDENT_AMBULATORY_CARE_PROVIDER_SITE_OTHER): Payer: Medicare Other | Admitting: Internal Medicine

## 2017-11-06 ENCOUNTER — Encounter: Payer: Self-pay | Admitting: Internal Medicine

## 2017-11-06 VITALS — BP 122/68 | HR 63 | Resp 16 | Ht 69.0 in | Wt 123.0 lb

## 2017-11-06 DIAGNOSIS — L03119 Cellulitis of unspecified part of limb: Secondary | ICD-10-CM

## 2017-11-06 DIAGNOSIS — L02519 Cutaneous abscess of unspecified hand: Secondary | ICD-10-CM

## 2017-11-06 DIAGNOSIS — H1033 Unspecified acute conjunctivitis, bilateral: Secondary | ICD-10-CM | POA: Diagnosis not present

## 2017-11-06 MED ORDER — AMOXICILLIN-POT CLAVULANATE 875-125 MG PO TABS: 1.0000 | ORAL_TABLET | Freq: Two times a day (BID) | ORAL | 0 refills | Status: AC

## 2017-11-06 MED ORDER — NEOMYCIN-POLYMYXIN-DEXAMETH 3.5-10000-0.1 OP SUSP: 2.0000 [drp] | Freq: Four times a day (QID) | OPHTHALMIC | 0 refills | Status: AC

## 2017-11-06 NOTE — Progress Notes (Signed)
Date:  11/06/2017   Name:  Mason Kane.   DOB:  1925-05-08   MRN:  [id number]   Chief Complaint: Eye Problem (2-3 Days of itching and redness and drainage in L and R eyes. Dry crusty and hard to open in mornings. Has been sitting outside daily but has never had issues with allergies  ) and Animal Bite (Cat bite on L hand 2-3 weeks ago. Has been having Angie dress wound and keep neosporin on it but still severe bruising on hand and going down the arm. )  Conjunctivitis   The current episode started 5 to 7 days ago. The onset is undetermined. The problem has been unchanged. The problem is moderate. Nothing relieves the symptoms. Nothing aggravates the symptoms. Associated symptoms include a fever. Pertinent negatives include no abdominal pain, no eye discharge and no eye redness. Both eyes are affected.The eye pain is not associated with movement. The eyelid exhibits redness.   Cat bite - occurred on back of left hand on 10/25/17 ago at home.  It was an indoor cat and is up to date on rabies. Has been keeping it clean and covered with ointment.  Tdap is up to date.   Review of Systems  Constitutional: Positive for chills, fatigue and fever.  Eyes: Positive for visual disturbance. Negative for discharge and redness.  Respiratory: Negative for chest tightness and shortness of breath.   Cardiovascular: Negative for chest pain.  Gastrointestinal: Negative for abdominal pain.  Skin: Positive for color change and wound.    Patient Active Problem List   Diagnosis Date Noted  . Encounter for surgical aftercare following surgery of digestive system 05/27/2017  . Choledocholithiasis 04/02/2017  . Candida esophagitis ([place]) 02/25/2017  . Closed fracture of great toe of right foot 08/18/2016  . Pulsatile neck mass 12/19/2015  . Type 2 diabetes mellitus with stage 3 chronic kidney disease, without long-term current use of insulin ([place]) 11/29/2015  . Uncontrollable vomiting 10/13/2015  .  Eosinophilic esophagitis 94/76/5465  . Dysphagia 09/27/2015  . Acquired hypothyroidism 11/14/2014  . Anemia due to chronic kidney disease 11/14/2014  . 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 Chronic renal insufficiency, stage III (moderate) ([place]) 11/14/2014  . CAD in native artery 11/14/2014  . Dyslipidemia 11/14/2014  . Essential (primary) hypertension 11/14/2014  . Acid reflux 11/14/2014  . Cirrhosis ([place]) 11/14/2014  . Cardiomyopathy, ischemic 11/14/2014  . Depression, major, single episode, moderate ([place]) 11/14/2014  . Idiopathic insomnia 11/14/2014  . Arthritis of knee, degenerative 11/14/2014  . Purpura senilis ([place]) 11/14/2014  . Aortic atherosclerosis ([place]) 11/14/2014  . Weight loss 11/14/2014  . Red blood cell antibody positive 02/18/2014    Prior to Admission medications   Medication Sig Start Date End Date Taking? Authorizing Provider  amLODipine (NORVASC) 2.5 MG tablet Take 1 tablet by mouth daily. 08/26/17 08/26/18 Yes [provider]  aspirin EC 81 MG tablet Take by mouth.   Yes [provider]  carvedilol (COREG) 3.125 MG tablet Take 1 tablet by mouth 2 (two) times daily. 02/19/17  Yes [provider]  epoetin alfa (EPOGEN,PROCRIT) 4000 UNIT/ML injection Inject 4,000 Units into the skin every 14 (fourteen) days.   Yes [provider]  finasteride (PROSCAR) 5 MG tablet Take 1 tablet (5 mg total) by mouth daily. 05/09/17  Yes Glean Hess, MD  furosemide (LASIX) 20 MG tablet Take 1 tablet (20 mg total) by mouth daily. 10/17/17  Yes Glean Hess, MD  glipiZIDE (  GLUCOTROL) 5 MG tablet TAKE 1/2 A TABLET BY MOUTH EVERY MORNING BEFORE BREAKFAST. 05/09/17  Yes Glean Hess, MD  glucose blood test strip 1 each by Other route 2 (two) times daily. 07/25/14  Yes [provider]  hydrALAZINE (APRESOLINE) 100 MG tablet Take 1 tablet by mouth 3 (three) times daily.   Yes [provider]  levothyroxine (SYNTHROID,  LEVOTHROID) 125 MCG tablet Take 150 mcg by mouth.    Yes [provider]  Multiple Vitamins-Minerals (MULTI COMPLETE PO) Take by mouth.   Yes [provider]  nitroGLYCERIN (NITROSTAT) 0.4 MG SL tablet Place 1 tablet (0.4 mg total) under the tongue as needed. 10/17/17  Yes Glean Hess, MD  ondansetron (ZOFRAN-ODT) 8 MG disintegrating tablet Take 1 tablet by mouth as needed.   Yes [provider]  Jonetta Speak LANCETS 40G [place]  07/17/15  Yes [provider]  pantoprazole (PROTONIX) 40 MG tablet TAKE 1 TABLET TWICE A DAY 30 TO 60 MINUTES PRIOR TO BREAKFAST AND DINNER 09/10/16  Yes [provider]  polyethylene glycol (MIRALAX / GLYCOLAX) packet Take 17 g by mouth daily.   Yes [provider]  predniSONE (DELTASONE) 5 MG tablet TAKE 1 TABLET BY MOUTH EVERY DAY WITH BREAKFAST 07/30/17  Yes Glean Hess, MD  sertraline (ZOLOFT) 25 MG tablet Take 1 tablet (25 mg total) by mouth daily. 10/17/17  Yes Glean Hess, MD  sodium bicarbonate 650 MG tablet TAKE 1 TABLET (650 MG TOTAL) BY MOUTH 2 (TWO) TIMES DAILY 10/24/17  Yes Glean Hess, MD  tamsulosin (FLOMAX) 0.4 MG CAPS capsule Take 1 capsule (0.4 mg total) by mouth 2 (two) times daily. 06/13/17  Yes Glean Hess, MD  thiamine 100 MG tablet Take 1 tablet by mouth daily. 08/02/14  Yes [provider]    Allergies  Allergen Reactions  . Remeron [Mirtazapine] Other (See Comments)    Excessive sedation    Past Surgical History:  Procedure Laterality Date  . CARDIAC DEFIBRILLATOR PLACEMENT  2015  . CORONARY ANGIOPLASTY WITH STENT PLACEMENT  12/2010  . CORONARY ANGIOPLASTY WITH STENT PLACEMENT  09/2011   DES placed  . CORONARY ARTERY BYPASS GRAFT  1987   5 vessel  . ERCP W/ SPHINCTEROTOMY AND BALLOON DILATION  03/2017  . IR PERC CHOLECYSTOSTOMY  03/2017    Social History   Tobacco Use  . Smoking status: Never Smoker  . Smokeless tobacco: Never Used  Substance Use  Topics  . Alcohol use: No    Alcohol/week: 0.0 oz  . Drug use: No     Medication list has been reviewed and updated.  PHQ 2/9 Scores 11/06/2017 04/18/2017 12/31/2016 12/31/2016  PHQ - 2 Score 0 0 1 0  PHQ- 9 Score - 0 - -    Physical Exam  Constitutional: He is oriented to person, place, and time. He appears well-developed. No distress.  HENT:  Head: Normocephalic and atraumatic.  Eyes: Pupils are equal, round, and reactive to light. EOM are normal. Right eye exhibits chemosis, discharge and exudate. Left eye exhibits chemosis, discharge and exudate. Right conjunctiva is injected. Left conjunctiva is injected.  Pulmonary/Chest: Effort normal. No respiratory distress.  Musculoskeletal: Normal range of motion.  Neurological: He is alert and oriented to person, place, and time.  Skin: Skin is warm and dry. No rash noted.     Psychiatric: He has a normal mood and affect. His behavior is normal. Thought content normal.    BP 122/68  Pulse 63   Resp 16   Ht 5\' 9"  (1.753 m)   Wt 123 lb (55.8 kg)   SpO2 98%   BMI 18.16 kg/m   Assessment and Plan: 1. Cellulitis and abscess of hand Continue TAO and dressings - amoxicillin-clavulanate (AUGMENTIN) 875-125 MG tablet; Take 1 tablet by mouth 2 (two) times daily for 10 days.  Dispense: 20 tablet; Refill: 0  2. Acute conjunctivitis of both eyes - neomycin-polymyxin b-dexamethasone (MAXITROL) 3.5-10000-0.1 SUSP; Place 2 drops into both eyes every 6 (six) hours for 10 days.  Dispense: 5 mL; Refill: 0   Meds ordered this encounter  Medications  . amoxicillin-clavulanate (AUGMENTIN) 875-125 MG tablet    Sig: Take 1 tablet by mouth 2 (two) times daily for 10 days.    Dispense:  20 tablet    Refill:  0  . neomycin-polymyxin b-dexamethasone (MAXITROL) 3.5-10000-0.1 SUSP    Sig: Place 2 drops into both eyes every 6 (six) hours for 10 days.    Dispense:  5 mL    Refill:  0    Partially dictated using Editor, commissioning. Any errors are  unintentional.  Halina Maidens, MD [facility]  11/06/2017

## 2017-12-26 ENCOUNTER — Other Ambulatory Visit: Payer: Self-pay | Admitting: Internal Medicine

## 2018-01-05 ENCOUNTER — Other Ambulatory Visit: Payer: Self-pay | Admitting: Internal Medicine

## 2018-01-05 ENCOUNTER — Ambulatory Visit (INDEPENDENT_AMBULATORY_CARE_PROVIDER_SITE_OTHER): Payer: Medicare Other

## 2018-01-05 VITALS — BP 122/60 | HR 60 | Temp 97.9°F | Resp 12 | Ht 69.0 in | Wt 123.6 lb

## 2018-01-05 DIAGNOSIS — Z0000 Encounter for general adult medical examination without abnormal findings: Secondary | ICD-10-CM

## 2018-01-05 MED ORDER — TAMSULOSIN HCL 0.4 MG PO CAPS: 0.4000 mg | ORAL_CAPSULE | Freq: Two times a day (BID) | ORAL | 1 refills | Status: AC

## 2018-01-05 NOTE — Patient Instructions (Addendum)
Mr. Mason Kane , Thank you for taking time to come for your Medicare Wellness Visit. I appreciate your ongoing commitment to your health goals. Please review the following plan we discussed and let me know if I can assist you in the future.   Screening recommendations/referrals: Colorectal Screening: No longer required  Vision and Dental Exams: Recommended annual ophthalmology exams for early detection of glaucoma and other disorders of the eye Recommended annual dental exams for proper oral hygiene  Diabetic Exams: Recommended annual diabetic eye exams for early detection of retinopathy Recommended annual diabetic foot exams for early detection of peripheral neuropathy.  Diabetic Eye Exam: Up to date Diabetic Foot Exam: Please keep your appointment as scheduled with Dr. Army Melia  Vaccinations: Influenza vaccine: Up to date Pneumococcal vaccine: Up to date Tdap vaccine: Up to date Shingles vaccine: Please call your insurance company to determine your out of pocket expense for the Shingrix vaccine. You may also receive this vaccine at your local pharmacy or Health Dept.  Advanced directives: Please bring a copy of your POA (Power of Attorney) and/or Living Will to your next appointment.  Goals: Recommend to drink at least 6-8 8oz glasses of water per day.  Next appointment: Please schedule your Annual Wellness Visit with your Nurse Health Advisor in one year.  Preventive Care 40 Years and Older, Male Preventive care refers to lifestyle choices and visits with your health care provider that can promote health and wellness. What does preventive care include?  A yearly physical exam. This is also called an annual well check.  Dental exams once or twice a year.  Routine eye exams. Ask your health care provider how often you should have your eyes checked.  Personal lifestyle choices, including:  Daily care of your teeth and gums.  Regular physical activity.  Eating a healthy  diet.  Avoiding tobacco and drug use.  Limiting alcohol use.  Practicing safe sex.  Taking low doses of aspirin every day.  Taking vitamin and mineral supplements as recommended by your health care provider. What happens during an annual well check? The services and screenings done by your health care provider during your annual well check will depend on your age, overall health, lifestyle risk factors, and family history of disease. Counseling  Your health care provider may ask you questions about your:  Alcohol use.  Tobacco use.  Drug use.  Emotional well-being.  Home and relationship well-being.  Sexual activity.  Eating habits.  History of falls.  Memory and ability to understand (cognition).  Work and work Statistician. Screening  You may have the following tests or measurements:  Height, weight, and BMI.  Blood pressure.  Lipid and cholesterol levels. These may be checked every 5 years, or more frequently if you are over 57 years old.  Skin check.  Lung cancer screening. You may have this screening every year starting at age 13 if you have a 30-pack-year history of smoking and currently smoke or have quit within the past 15 years.  Fecal occult blood test (FOBT) of the stool. You may have this test every year starting at age 36.  Flexible sigmoidoscopy or colonoscopy. You may have a sigmoidoscopy every 5 years or a colonoscopy every 10 years starting at age 26.  Prostate cancer screening. Recommendations will vary depending on your family history and other risks.  Hepatitis C blood test.  Hepatitis B blood test.  Sexually transmitted disease (STD) testing.  Diabetes screening. This is done by checking your blood sugar (  glucose) after you have not eaten for a while (fasting). You may have this done every 1-3 years.  Abdominal aortic aneurysm (AAA) screening. You may need this if you are a current or former smoker.  Osteoporosis. You may be screened  starting at age 80 if you are at high risk. Talk with your health care provider about your test results, treatment options, and if necessary, the need for more tests. Vaccines  Your health care provider may recommend certain vaccines, such as:  Influenza vaccine. This is recommended every year.  Tetanus, diphtheria, and acellular pertussis (Tdap, Td) vaccine. You may need a Td booster every 10 years.  Zoster vaccine. You may need this after age 49.  Pneumococcal 13-valent conjugate (PCV13) vaccine. One dose is recommended after age 26.  Pneumococcal polysaccharide (PPSV23) vaccine. One dose is recommended after age 75. Talk to your health care provider about which screenings and vaccines you need and how often you need them. This information is not intended to replace advice given to you by your health care provider. Make sure you discuss any questions you have with your health care provider. Document Released: 07/21/2015 Document Revised: 03/13/2016 Document Reviewed: 04/25/2015 Elsevier Interactive Patient Education  2017 [place] Prevention in the Home Falls can cause injuries. They can happen to people of all ages. There are many things you can do to make your home safe and to help prevent falls. What can I do on the outside of my home?  Regularly fix the edges of walkways and driveways and fix any cracks.  Remove anything that might make you trip as you walk through a door, such as a raised step or threshold.  Trim any bushes or trees on the path to your home.  Use bright outdoor lighting.  Clear any walking paths of anything that might make someone trip, such as rocks or tools.  Regularly check to see if handrails are loose or broken. Make sure that both sides of any steps have handrails.  Any raised decks and porches should have guardrails on the edges.  Have any leaves, snow, or ice cleared regularly.  Use sand or salt on walking paths during  winter.  Clean up any spills in your garage right away. This includes oil or grease spills. What can I do in the bathroom?  Use night lights.  Install grab bars by the toilet and in the tub and shower. Do not use towel bars as grab bars.  Use non-skid mats or decals in the tub or shower.  If you need to sit down in the shower, use a plastic, non-slip stool.  Keep the floor dry. Clean up any water that spills on the floor as soon as it happens.  Remove soap buildup in the tub or shower regularly.  Attach bath mats securely with double-sided non-slip rug tape.  Do not have throw rugs and other things on the floor that can make you trip. What can I do in the bedroom?  Use night lights.  Make sure that you have a light by your bed that is easy to reach.  Do not use any sheets or blankets that are too big for your bed. They should not hang down onto the floor.  Have a firm chair that has side arms. You can use this for support while you get dressed.  Do not have throw rugs and other things on the floor that can make you trip. What can I do in the kitchen?  Clean up any spills right away.  Avoid walking on wet floors.  Keep items that you use a lot in easy-to-reach places.  If you need to reach something above you, use a strong step stool that has a grab bar.  Keep electrical cords out of the way.  Do not use floor polish or wax that makes floors slippery. If you must use wax, use non-skid floor wax.  Do not have throw rugs and other things on the floor that can make you trip. What can I do with my stairs?  Do not leave any items on the stairs.  Make sure that there are handrails on both sides of the stairs and use them. Fix handrails that are broken or loose. Make sure that handrails are as long as the stairways.  Check any carpeting to make sure that it is firmly attached to the stairs. Fix any carpet that is loose or worn.  Avoid having throw rugs at the top or  bottom of the stairs. If you do have throw rugs, attach them to the floor with carpet tape.  Make sure that you have a light switch at the top of the stairs and the bottom of the stairs. If you do not have them, ask someone to add them for you. What else can I do to help prevent falls?  Wear shoes that:  Do not have high heels.  Have rubber bottoms.  Are comfortable and fit you well.  Are closed at the toe. Do not wear sandals.  If you use a stepladder:  Make sure that it is fully opened. Do not climb a closed stepladder.  Make sure that both sides of the stepladder are locked into place.  Ask someone to hold it for you, if possible.  Clearly mark and make sure that you can see:  Any grab bars or handrails.  First and last steps.  Where the edge of each step is.  Use tools that help you move around (mobility aids) if they are needed. These include:  Canes.  Walkers.  Scooters.  Crutches.  Turn on the lights when you go into a dark area. Replace any light bulbs as soon as they burn out.  Set up your furniture so you have a clear path. Avoid moving your furniture around.  If any of your floors are uneven, fix them.  If there are any pets around you, be aware of where they are.  Review your medicines with your doctor. Some medicines can make you feel dizzy. This can increase your chance of falling. Ask your doctor what other things that you can do to help prevent falls. This information is not intended to replace advice given to you by your health care provider. Make sure you discuss any questions you have with your health care provider. Document Released: 04/20/2009 Document Revised: 11/30/2015 Document Reviewed: 07/29/2014 Elsevier Interactive Patient Education  2017 [employer].

## 2018-01-05 NOTE — Progress Notes (Signed)
Subjective:   Mason Kane. is a 84 y.o. male who presents for Medicare Annual/Subsequent preventive examination.  Review of Systems:  N/A Cardiac Risk Factors include: advanced age (>21men, >53 women);diabetes mellitus;dyslipidemia;hypertension;male gender;sedentary lifestyle     Objective:    Vitals: BP 122/60 (BP Location: Right Arm, Patient Position: Sitting, Cuff Size: Normal)   Pulse 60   Temp 97.9 F (36.6 C) (Oral)   Resp 12   Ht 5\' 9"  (1.753 m)   Wt 123 lb 9.6 oz (56.1 kg)   SpO2 97%   BMI 18.25 kg/m   Body mass index is 18.25 kg/m.  Advanced Directives 01/05/2018 12/31/2016 11/22/2016 10/26/2015 10/13/2015 09/27/2015  Does Patient Have a Medical Advance Directive? Yes Yes Yes No No No  Type of Paramedic of [place];Living will Living will Living will - - -  Copy of [place] in Chart? No - copy requested - - - - -  Would patient like information on creating a medical advance directive? - - - No - patient declined information No - patient declined information No - patient declined information    Tobacco Social History   Tobacco Use  Smoking Status Never Smoker  Smokeless Tobacco Never Used  Tobacco Comment   smoking cessation materials not required     Counseling given: No Comment: smoking cessation materials not required  Clinical Intake:  Pre-visit preparation completed: Yes  Pain : No/denies pain   BMI - recorded: 18.25 Nutritional Risks: None  Nutrition Risk Assessment: Has the patient had any N/V/D within the last 2 months?  No Does the patient have any non-healing wounds?  No Has the patient had any unintentional weight loss or weight gain?  No  Is the patient diabetic?  Yes If diabetic, was a CBG obtained today?  No Did the patient bring in their glucometer from home?  No Comments: Pt monitors CBG's twice daily. Denies any financial strains with the device or supplies.  Diabetic Exams: Diabetic Eye  Exam: Completed 10/14/17 Diabetic Foot Exam: Completed 12/31/16. Pt has been advised about the importance in completing this exam. Pt scheduled for diabetic foot exam with Dr. Army Melia on 04/24/18.  How often do you need to have someone help you when you read instructions, pamphlets, or other written materials from your doctor or pharmacy?: 1 - Never  Interpreter Needed?: No  Information entered by :: Idell Pickles, LPN  Past Medical History:  Diagnosis Date  . Anemia   . Anxiety   . Depression   . Diabetes mellitus without complication ([place])   . Hypercholesteremia   . Hypertension   . Thyroid disease    Past Surgical History:  Procedure Laterality Date  . CARDIAC DEFIBRILLATOR PLACEMENT  2015  . CORONARY ANGIOPLASTY WITH STENT PLACEMENT  12/2010  . CORONARY ANGIOPLASTY WITH STENT PLACEMENT  09/2011   DES placed  . CORONARY ARTERY BYPASS GRAFT  1987   5 vessel  . ERCP W/ SPHINCTEROTOMY AND BALLOON DILATION  03/2017  . IR PERC CHOLECYSTOSTOMY  03/2017   Family History  Problem Relation Age of Onset  . Stroke Mother   . Heart disease Mother   . CAD Father   . Diabetes Brother   . Cancer Sister   . Heart disease Brother    Social History   Socioeconomic History  . Marital status: Widowed    Spouse name: Not on file  . Number of children: 3  . Years of education: Not on file  .  Highest education level: 8th grade  Occupational History  . Occupation: Retired  Scientific laboratory technician  . Financial resource strain: Not hard at all  . Food insecurity:    Worry: Never true    Inability: Never true  . Transportation needs:    Medical: No    Non-medical: No  Tobacco Use  . Smoking status: Never Smoker  . Smokeless tobacco: Never Used  . Tobacco comment: smoking cessation materials not required  Substance and Sexual Activity  . Alcohol use: No    Alcohol/week: 0.0 oz  . Drug use: No  . Sexual activity: Not Currently  Lifestyle  . Physical activity:    Days per week: 0 days     Minutes per session: 0 min  . Stress: Not at all  Relationships  . Social connections:    Talks on phone: Patient refused    Gets together: Patient refused    Attends religious service: Patient refused    Active member of club or organization: Patient refused    Attends meetings of clubs or organizations: Patient refused    Relationship status: Widowed  Other Topics Concern  . Not on file  Social History Narrative  . Not on file    Outpatient Encounter Medications as of 01/05/2018  Medication Sig  . amLODipine (NORVASC) 2.5 MG tablet Take 1 tablet by mouth daily.  Marland Kitchen aspirin EC 81 MG tablet Take by mouth.  . carvedilol (COREG) 3.125 MG tablet Take 1 tablet by mouth 2 (two) times daily.  Marland Kitchen epoetin alfa (EPOGEN,PROCRIT) 4000 UNIT/ML injection Inject 4,000 Units into the skin every 14 (fourteen) days.  . finasteride (PROSCAR) 5 MG tablet Take 1 tablet (5 mg total) by mouth daily.  . furosemide (LASIX) 20 MG tablet Take 1 tablet (20 mg total) by mouth daily.  Marland Kitchen glipiZIDE (GLUCOTROL) 5 MG tablet TAKE 1/2 A TABLET BY MOUTH EVERY MORNING BEFORE BREAKFAST.  Marland Kitchen glucose blood test strip 1 each by Other route 2 (two) times daily.  . hydrALAZINE (APRESOLINE) 100 MG tablet Take 1 tablet by mouth 3 (three) times daily.  . Levothyroxine Sodium 150 MCG CAPS Take 150 mcg by mouth.   . Multiple Vitamins-Minerals (MULTI COMPLETE PO) Take by mouth.  . nitroGLYCERIN (NITROSTAT) 0.4 MG SL tablet Place 1 tablet (0.4 mg total) under the tongue as needed.  . ondansetron (ZOFRAN-ODT) 8 MG disintegrating tablet Take 1 tablet by mouth as needed.  Glory Rosebush DELICA LANCETS 07M MISC   . pantoprazole (PROTONIX) 40 MG tablet TAKE 1 TABLET TWICE A DAY 30 TO 60 MINUTES PRIOR TO BREAKFAST AND DINNER  . polyethylene glycol (MIRALAX / GLYCOLAX) packet Take 17 g by mouth daily.  . predniSONE (DELTASONE) 5 MG tablet TAKE 1 TABLET BY MOUTH EVERY DAY WITH BREAKFAST  . sertraline (ZOLOFT) 25 MG tablet Take 1 tablet (25 mg  total) by mouth daily.  . sodium bicarbonate 650 MG tablet TAKE 1 TABLET (650 MG TOTAL) BY MOUTH 2 (TWO) TIMES DAILY  . tamsulosin (FLOMAX) 0.4 MG CAPS capsule Take 1 capsule (0.4 mg total) by mouth 2 (two) times daily.  Marland Kitchen thiamine 100 MG tablet Take 1 tablet by mouth daily.  . finasteride (PROSCAR) 5 MG tablet TAKE 1 TABLET (5 MG TOTAL) BY MOUTH DAILY.   No facility-administered encounter medications on file as of 01/05/2018.     Activities of Daily Living In your present state of health, do you have any difficulty performing the following activities: 01/05/2018  Hearing? N  Comment denies  hearing aids  Vision? N  Comment wears eyeglasses  Difficulty concentrating or making decisions? Y  Comment short term memory loss  Walking or climbing stairs? N  Dressing or bathing? N  Doing errands, shopping? N  Preparing Food and eating ? N  Comment denies dentures  Using the Toilet? N  In the past six months, have you accidently leaked urine? N  Do you have problems with loss of bowel control? N  Managing your Medications? N  Managing your Finances? N  Housekeeping or managing your Housekeeping? N  Some recent data might be hidden    Patient Care Team: Glean Hess, MD as PCP - General (Internal Medicine) Franklyn Lor, MD as Referring Physician (Nephrology) Redmond Baseman, MD as Referring Physician (Cardiothoracic Surgery) Deal, Verlan Friends, PA-C (Gastroenterology) [facility] (Ophthalmology)   Assessment:   This is a routine wellness examination for Abisai.  Exercise Activities and Dietary recommendations Current Exercise Habits: The patient does not participate in regular exercise at present, Exercise limited by: None identified  Goals    . DIET - INCREASE WATER INTAKE     Recommend to drink at least 6-8 8oz glasses of water per day.       Fall Risk Fall Risk  01/05/2018 11/06/2017 12/31/2016 01/02/2016 11/29/2015  Falls in the past year? No No Yes Yes No  Number falls in  past yr: - - 1 1 -  Injury with Fall? - - Yes Yes -  Comment - - broke big toe - -  Risk Factor Category  - - High Fall Risk High Fall Risk -  Risk for fall due to : Impaired vision;Impaired balance/gait;History of fall(s);Medication side effect - - - -  Follow up - - - Falls prevention discussed -   FALL RISK PREVENTION PERTAINING TO HOME: Is your home free of loose throw rugs in walkways, pet beds, electrical cords, etc? Yes Is there adequate lighting in your home to reduce risk of falls?  Yes Are there stairs in or around your home WITH handrails? Yes  ASSISTIVE DEVICES UTILIZED TO PREVENT FALLS: Use of a cane, walker or w/c? Yes, cane Grab bars in the bathroom? Yes  Shower chair or a place to sit while bathing? Yes An elevated toilet seat or a handicapped toilet? Yes  Timed Get Up and Go Performed: Yes. Pt ambulated 10 feet within 28 sec. Gait slow, steady and without the use of an assistive device. No intervention required at this time. Fall risk prevention has been discussed.  Community Resource Referral:  [place] Referral not required at this time.   Depression Screen PHQ 2/9 Scores 01/05/2018 11/06/2017 04/18/2017 12/31/2016  PHQ - 2 Score 0 0 0 1  PHQ- 9 Score 0 - 0 -    Cognitive Function     6CIT Screen 01/05/2018 12/31/2016  What Year? 0 points 0 points  What month? 0 points 0 points  What time? 0 points 0 points  Count back from 20 0 points 0 points  Months in reverse 0 points 0 points  Repeat phrase 4 points 6 points  Total Score 4 6    Immunization History  Administered Date(s) Administered  . Hep A / Hep B 12/04/2016, 12/24/2016  . Influenza, High Dose Seasonal PF 04/18/2017  . Influenza,inj,Quad PF,6+ Mos 05/08/2015, 04/02/2016  . Pneumococcal Conjugate-13 11/29/2015  . Pneumococcal Polysaccharide-23 12/07/2011  . Tdap 01/02/2016    Qualifies for Shingles Vaccine? Yes. Due for Shingrix. Education has been provided regarding the  importance of  this vaccine. Pt has been advised to call his insurance company to determine his out of pocket expense. Advised he may also receive this vaccine at his local pharmacy or Health Dept. Verbalized acceptance and understanding.  Screening Tests Health Maintenance  Topic Date Due  . FOOT EXAM  12/31/2017  . INFLUENZA VACCINE  02/05/2018  . HEMOGLOBIN A1C  04/18/2018  . OPHTHALMOLOGY EXAM  10/15/2018  . TETANUS/TDAP  01/01/2026  . PNA vac Low Risk Adult  Completed   Cancer Screenings: Lung: Low Dose CT Chest recommended if Age 35-80 years, 30 pack-year currently smoking OR have quit w/in 15years. Patient does not qualify. Colorectal: No longer required  Additional Screenings: Hepatitis C Screening: Does not qualify     Plan:  I have personally reviewed and addressed the Medicare Annual Wellness questionnaire and have noted the following in the patient's chart:  A. Medical and social history B. Use of alcohol, tobacco or illicit drugs  C. Current medications and supplements D. Functional ability and status E.  Nutritional status F.  Physical activity G. Advance directives H. List of other physicians I.  Hospitalizations, surgeries, and ER visits in previous 12 months J.  [place] such as hearing and vision if needed, cognitive and depression L. Referrals and appointments  In addition, I have reviewed and discussed with patient certain preventive protocols, quality metrics, and best practice recommendations. A written personalized care plan for preventive services as well as general preventive health recommendations were provided to patient.  Signed,  Aleatha Borer, LPN Nurse Health Advisor  MD Recommendations: Due for Shingrix. Education has been provided regarding the importance of this vaccine. Pt has been advised to call his insurance company to determine his out of pocket expense. Advised he may also receive this vaccine at his local pharmacy or Health Dept.  Verbalized acceptance and understanding.  Diabetic Foot Exam: Completed 12/31/16. Pt has been advised about the importance in completing this exam. Pt scheduled for diabetic foot exam with Dr. Army Melia on 04/24/18.

## 2018-01-31 ENCOUNTER — Other Ambulatory Visit: Payer: Self-pay | Admitting: Internal Medicine

## 2018-01-31 DIAGNOSIS — K200 Eosinophilic esophagitis: Secondary | ICD-10-CM

## 2018-03-17 ENCOUNTER — Ambulatory Visit (INDEPENDENT_AMBULATORY_CARE_PROVIDER_SITE_OTHER): Payer: Medicare Other | Admitting: Internal Medicine

## 2018-03-17 ENCOUNTER — Other Ambulatory Visit: Payer: Self-pay | Admitting: Internal Medicine

## 2018-03-17 ENCOUNTER — Encounter: Payer: Self-pay | Admitting: Internal Medicine

## 2018-03-17 VITALS — BP 114/58 | HR 63 | Temp 98.0°F | Ht 69.0 in | Wt 120.0 lb

## 2018-03-17 DIAGNOSIS — R350 Frequency of micturition: Secondary | ICD-10-CM | POA: Diagnosis not present

## 2018-03-17 DIAGNOSIS — N3000 Acute cystitis without hematuria: Secondary | ICD-10-CM

## 2018-03-17 LAB — POC URINALYSIS WITH MICROSCOPIC (NON AUTO)MANUAL RESULT
BILIRUBIN UA: NEGATIVE
Blood, UA: NEGATIVE
Crystals: 0
EPITHELIAL CELLS, URINE PER MICROSCOPY: 0
GLUCOSE UA: NEGATIVE
Ketones, UA: NEGATIVE
Mucus, UA: 0
Nitrite, UA: NEGATIVE
Protein, UA: POSITIVE — AB
RBC: 0 M/uL — AB (ref 4.69–6.13)
Spec Grav, UA: 1.015 (ref 1.010–1.025)
UROBILINOGEN UA: 0.2 U/dL
pH, UA: 5 (ref 5.0–8.0)

## 2018-03-17 MED ORDER — CEPHALEXIN 500 MG PO CAPS: 500.0000 mg | ORAL_CAPSULE | Freq: Three times a day (TID) | ORAL | 0 refills | Status: AC

## 2018-03-17 NOTE — Progress Notes (Signed)
Date:  03/17/2018   Name:  Mason Kane.   DOB:  Feb 09, 1925   MRN:  [id number]   Chief Complaint: Urinary Frequency (Feeling the urge to go to the bathroom. Started 3 weeks ago. Tried AZO. lower right back pain started a week ago. )  Urinary Frequency   This is a new problem. The current episode started 1 to 4 weeks ago. The problem occurs every urination. The quality of the pain is described as aching. The pain is mild. There has been no fever. Associated symptoms include frequency. Pertinent negatives include no chills, hematuria or urgency. He has tried increased fluids for the symptoms.     Review of Systems  Constitutional: Negative for chills, fatigue and unexpected weight change.  Respiratory: Negative for cough, chest tightness, shortness of breath and wheezing.   Cardiovascular: Negative for chest pain and palpitations.  Gastrointestinal: Negative for abdominal pain, constipation and diarrhea.  Genitourinary: Positive for frequency. Negative for hematuria and urgency.  Neurological: Negative for dizziness and headaches.  Psychiatric/Behavioral: Negative for sleep disturbance.    Patient Active Problem List   Diagnosis Date Noted  . Choledocholithiasis 04/02/2017  . Candida esophagitis ([place]) 02/25/2017  . Pulsatile neck mass 12/19/2015  . Type 2 diabetes mellitus with stage 3 chronic kidney disease, without long-term current use of insulin ([place]) 11/29/2015  . Uncontrollable vomiting 10/13/2015  . Eosinophilic esophagitis 64/33/2951  . Dysphagia 09/27/2015  . Acquired hypothyroidism 11/14/2014  . Anemia due to chronic kidney disease 11/14/2014  . 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 Chronic renal insufficiency, stage III (moderate) ([place]) 11/14/2014  . CAD in native artery 11/14/2014  . Dyslipidemia 11/14/2014  . Essential (primary) hypertension 11/14/2014  . Cirrhosis ([place]) 11/14/2014  . Cardiomyopathy, ischemic 11/14/2014  . Depression, major, single episode,  moderate ([place]) 11/14/2014  . Idiopathic insomnia 11/14/2014  . Arthritis of knee, degenerative 11/14/2014  . Purpura senilis ([place]) 11/14/2014  . Aortic atherosclerosis ([place]) 11/14/2014  . Weight loss 11/14/2014  . Red blood cell antibody positive 02/18/2014    Allergies  Allergen Reactions  . Remeron [Mirtazapine] Other (See Comments)    Excessive sedation    Past Surgical History:  Procedure Laterality Date  . CARDIAC DEFIBRILLATOR PLACEMENT  2015  . CORONARY ANGIOPLASTY WITH STENT PLACEMENT  12/2010  . CORONARY ANGIOPLASTY WITH STENT PLACEMENT  09/2011   DES placed  . CORONARY ARTERY BYPASS GRAFT  1987   5 vessel  . ERCP W/ SPHINCTEROTOMY AND BALLOON DILATION  03/2017  . IR PERC CHOLECYSTOSTOMY  03/2017    Social History   Tobacco Use  . Smoking status: Never Smoker  . Smokeless tobacco: Never Used  . Tobacco comment: smoking cessation materials not required  Substance Use Topics  . Alcohol use: No    Alcohol/week: 0.0 standard drinks  . Drug use: No     Medication list has been reviewed and updated.  Current Meds  Medication Sig  . amLODipine (NORVASC) 2.5 MG tablet Take 1 tablet by mouth daily.  Marland Kitchen aspirin EC 81 MG tablet Take by mouth.  . carvedilol (COREG) 3.125 MG tablet Take 1 tablet by mouth 2 (two) times daily.  Marland Kitchen epoetin alfa (EPOGEN,PROCRIT) 4000 UNIT/ML injection Inject 4,000 Units into the skin every 14 (fourteen) days.  . finasteride (PROSCAR) 5 MG tablet Take 1 tablet (5 mg total) by mouth daily.  . finasteride (PROSCAR) 5 MG tablet TAKE 1 TABLET (5 MG TOTAL) BY MOUTH DAILY.  . furosemide (LASIX) 20  MG tablet Take 1 tablet (20 mg total) by mouth daily.  Marland Kitchen glipiZIDE (GLUCOTROL) 5 MG tablet TAKE 1/2 A TABLET BY MOUTH EVERY MORNING BEFORE BREAKFAST.  Marland Kitchen glucose blood test strip 1 each by Other route 2 (two) times daily.  . hydrALAZINE (APRESOLINE) 100 MG tablet Take 1 tablet by mouth 3 (three) times daily.  . Levothyroxine Sodium 150 MCG CAPS Take 150  mcg by mouth.   . Multiple Vitamins-Minerals (MULTI COMPLETE PO) Take by mouth.  . nitroGLYCERIN (NITROSTAT) 0.4 MG SL tablet Place 1 tablet (0.4 mg total) under the tongue as needed.  . ondansetron (ZOFRAN-ODT) 8 MG disintegrating tablet Take 1 tablet by mouth as needed.  Glory Rosebush DELICA LANCETS 16X MISC   . pantoprazole (PROTONIX) 40 MG tablet TAKE 1 TABLET TWICE A DAY 30 TO 60 MINUTES PRIOR TO BREAKFAST AND DINNER  . polyethylene glycol (MIRALAX / GLYCOLAX) packet Take 17 g by mouth daily.  . predniSONE (DELTASONE) 5 MG tablet TAKE 1 TABLET BY MOUTH EVERY DAY WITH BREAKFAST  . sertraline (ZOLOFT) 25 MG tablet Take 1 tablet (25 mg total) by mouth daily.  . sodium bicarbonate 650 MG tablet TAKE 1 TABLET (650 MG TOTAL) BY MOUTH 2 (TWO) TIMES DAILY  . tamsulosin (FLOMAX) 0.4 MG CAPS capsule Take 1 capsule (0.4 mg total) by mouth 2 (two) times daily.  Marland Kitchen thiamine 100 MG tablet Take 1 tablet by mouth daily.    PHQ 2/9 Scores 01/05/2018 11/06/2017 04/18/2017 12/31/2016  PHQ - 2 Score 0 0 0 1  PHQ- 9 Score 0 - 0 -    Physical Exam  Constitutional: He appears well-developed.  Neck: Normal range of motion. Neck supple.  Cardiovascular: Normal rate, regular rhythm and normal heart sounds.  Pulmonary/Chest: Effort normal and breath sounds normal.  Abdominal: Soft. Bowel sounds are normal. There is no tenderness. There is CVA tenderness. There is no guarding.    Neurological: He is alert.   Wt Readings from Last 3 Encounters:  03/17/18 120 lb (54.4 kg)  01/05/18 123 lb 9.6 oz (56.1 kg)  11/06/17 123 lb (55.8 kg)     BP (!) 114/58 (BP Location: Right Arm, Patient Position: Sitting, Cuff Size: Normal)   Pulse 63   Temp 98 F (36.7 C) (Oral)   Ht 5\' 9"  (1.753 m)   Wt 120 lb (54.4 kg)   SpO2 98%   BMI 17.72 kg/m   Assessment and Plan: 1. Urinary frequency - POC urinalysis w microscopic (non auto)  2. Acute cystitis without hematuria - cephALEXin (KEFLEX) 500 MG capsule; Take 1  capsule (500 mg total) by mouth 3 (three) times daily.  Dispense: 21 capsule; Refill: 0 - Urine Culture   Meds ordered this encounter  Medications  . cephALEXin (KEFLEX) 500 MG capsule    Sig: Take 1 capsule (500 mg total) by mouth 3 (three) times daily.    Dispense:  21 capsule    Refill:  0    Partially dictated using Editor, commissioning. Any errors are unintentional.  Halina Maidens, MD [facility]  03/17/2018

## 2018-03-19 LAB — URINE CULTURE: ORGANISM ID, BACTERIA: NO GROWTH

## 2018-03-24 ENCOUNTER — Other Ambulatory Visit: Payer: Self-pay | Admitting: Family Medicine

## 2018-03-26 ENCOUNTER — Other Ambulatory Visit: Payer: Self-pay | Admitting: Internal Medicine

## 2018-04-15 ENCOUNTER — Encounter: Payer: Self-pay | Admitting: Internal Medicine

## 2018-04-15 DIAGNOSIS — E46 Unspecified protein-calorie malnutrition: Secondary | ICD-10-CM | POA: Insufficient documentation

## 2018-04-15 DIAGNOSIS — Z934 Other artificial openings of gastrointestinal tract status: Secondary | ICD-10-CM | POA: Insufficient documentation

## 2018-04-16 ENCOUNTER — Other Ambulatory Visit: Payer: Self-pay | Admitting: Internal Medicine

## 2018-04-16 DIAGNOSIS — F321 Major depressive disorder, single episode, moderate: Secondary | ICD-10-CM

## 2018-04-21 ENCOUNTER — Other Ambulatory Visit: Payer: Self-pay | Admitting: Internal Medicine

## 2018-04-23 ENCOUNTER — Other Ambulatory Visit: Payer: Self-pay | Admitting: Internal Medicine

## 2018-04-23 DIAGNOSIS — K7460 Unspecified cirrhosis of liver: Secondary | ICD-10-CM

## 2018-04-24 ENCOUNTER — Ambulatory Visit: Payer: Medicare Other | Admitting: Internal Medicine

## 2018-06-07 DEATH — deceased

## 2018-06-24 ENCOUNTER — Other Ambulatory Visit: Payer: Self-pay | Admitting: Internal Medicine
